# Patient Record
Sex: Male | Born: 1945 | Race: White | Hispanic: No | Marital: Single | Smoking: Never smoker
Health system: Southern US, Community
[De-identification: ages and names within clinical notes are randomized; demographics above are authoritative.]

## PROBLEM LIST (undated history)

## (undated) DIAGNOSIS — C801 Malignant (primary) neoplasm, unspecified: Secondary | ICD-10-CM

## (undated) DIAGNOSIS — M1990 Unspecified osteoarthritis, unspecified site: Secondary | ICD-10-CM

## (undated) DIAGNOSIS — C49A4 Gastrointestinal stromal tumor of large intestine: Secondary | ICD-10-CM

## (undated) HISTORY — PX: OTHER SURGICAL HISTORY: SHX169

## (undated) HISTORY — DX: Gastrointestinal stromal tumor of large intestine: C49.A4

---

## 2013-01-11 ENCOUNTER — Ambulatory Visit: Payer: Self-pay | Admitting: Oncology

## 2013-02-16 ENCOUNTER — Ambulatory Visit: Payer: Self-pay | Admitting: Oncology

## 2013-02-16 LAB — COMPREHENSIVE METABOLIC PANEL
Albumin: 3.6 g/dL (ref 3.4–5.0)
Alkaline Phosphatase: 95 U/L (ref 50–136)
BUN: 17 mg/dL (ref 7–18)
Bilirubin,Total: 0.3 mg/dL (ref 0.2–1.0)
Calcium, Total: 9.3 mg/dL (ref 8.5–10.1)
Co2: 30 mmol/L (ref 21–32)
Osmolality: 276 (ref 275–301)
Potassium: 3.7 mmol/L (ref 3.5–5.1)
SGOT(AST): 15 U/L (ref 15–37)
SGPT (ALT): 30 U/L (ref 12–78)
Sodium: 137 mmol/L (ref 136–145)
Total Protein: 7.5 g/dL (ref 6.4–8.2)

## 2013-02-16 LAB — CBC CANCER CENTER
Basophil #: 0.1 x10 3/mm (ref 0.0–0.1)
Eosinophil #: 0.1 x10 3/mm (ref 0.0–0.7)
HCT: 47 % (ref 40.0–52.0)
HGB: 15.7 g/dL (ref 13.0–18.0)
Lymphocyte #: 1.8 x10 3/mm (ref 1.0–3.6)
MCH: 30.7 pg (ref 26.0–34.0)
MCHC: 33.4 g/dL (ref 32.0–36.0)
MCV: 92 fL (ref 80–100)
Monocyte %: 8.2 %
Neutrophil #: 6.3 x10 3/mm (ref 1.4–6.5)
Neutrophil %: 70 %
Platelet: 325 x10 3/mm (ref 150–440)
RBC: 5.11 10*6/uL (ref 4.40–5.90)
RDW: 13.6 % (ref 11.5–14.5)

## 2013-02-16 LAB — LACTATE DEHYDROGENASE: LDH: 165 U/L (ref 85–241)

## 2013-03-06 ENCOUNTER — Ambulatory Visit: Payer: Self-pay | Admitting: Oncology

## 2013-05-15 ENCOUNTER — Ambulatory Visit: Payer: Self-pay | Admitting: Gastroenterology

## 2013-05-16 LAB — PATHOLOGY REPORT

## 2013-05-17 ENCOUNTER — Ambulatory Visit: Payer: Self-pay | Admitting: Oncology

## 2013-05-18 LAB — COMPREHENSIVE METABOLIC PANEL
ALBUMIN: 3.6 g/dL (ref 3.4–5.0)
AST: 16 U/L (ref 15–37)
Alkaline Phosphatase: 77 U/L
Anion Gap: 8 (ref 7–16)
BILIRUBIN TOTAL: 0.4 mg/dL (ref 0.2–1.0)
BUN: 11 mg/dL (ref 7–18)
CREATININE: 0.79 mg/dL (ref 0.60–1.30)
Calcium, Total: 8.3 mg/dL — ABNORMAL LOW (ref 8.5–10.1)
Chloride: 101 mmol/L (ref 98–107)
Co2: 30 mmol/L (ref 21–32)
EGFR (African American): >60
EGFR (Non-African Amer.): >60
GLUCOSE: 103 mg/dL — AB (ref 65–99)
OSMOLALITY: 277 (ref 275–301)
Potassium: 3.8 mmol/L (ref 3.5–5.1)
SGPT (ALT): 24 U/L (ref 12–78)
Sodium: 139 mmol/L (ref 136–145)
Total Protein: 7 g/dL (ref 6.4–8.2)

## 2013-05-18 LAB — CBC CANCER CENTER
BASOS PCT: 0.6 %
Basophil #: 0.1 x10 3/mm (ref 0.0–0.1)
EOS ABS: 0.1 x10 3/mm (ref 0.0–0.7)
Eosinophil %: 1.3 %
HCT: 46.2 % (ref 40.0–52.0)
HGB: 15.4 g/dL (ref 13.0–18.0)
LYMPHS PCT: 13.3 %
Lymphocyte #: 1.2 x10 3/mm (ref 1.0–3.6)
MCH: 30.4 pg (ref 26.0–34.0)
MCHC: 33.3 g/dL (ref 32.0–36.0)
MCV: 92 fL (ref 80–100)
Monocyte #: 0.6 x10 3/mm (ref 0.2–1.0)
Monocyte %: 6.5 %
Neutrophil #: 7.2 x10 3/mm — ABNORMAL HIGH (ref 1.4–6.5)
Neutrophil %: 78.3 %
Platelet: 306 x10 3/mm (ref 150–440)
RBC: 5.05 10*6/uL (ref 4.40–5.90)
RDW: 13.6 % (ref 11.5–14.5)
WBC: 9.2 x10 3/mm (ref 3.8–10.6)

## 2013-06-04 ENCOUNTER — Ambulatory Visit: Payer: Self-pay | Admitting: Oncology

## 2013-07-05 ENCOUNTER — Ambulatory Visit: Payer: Self-pay | Admitting: Oncology

## 2013-08-21 ENCOUNTER — Ambulatory Visit: Payer: Self-pay | Admitting: Oncology

## 2013-08-22 LAB — COMPREHENSIVE METABOLIC PANEL
ANION GAP: 5 — AB (ref 7–16)
Albumin: 3.7 g/dL (ref 3.4–5.0)
Alkaline Phosphatase: 89 U/L
BUN: 14 mg/dL (ref 7–18)
Bilirubin,Total: 0.5 mg/dL (ref 0.2–1.0)
CHLORIDE: 101 mmol/L (ref 98–107)
CREATININE: 0.75 mg/dL (ref 0.60–1.30)
Calcium, Total: 9.1 mg/dL (ref 8.5–10.1)
Co2: 32 mmol/L (ref 21–32)
EGFR (Non-African Amer.): >60
Glucose: 111 mg/dL — ABNORMAL HIGH (ref 65–99)
Osmolality: 277 (ref 275–301)
Potassium: 4.2 mmol/L (ref 3.5–5.1)
SGOT(AST): 14 U/L — ABNORMAL LOW (ref 15–37)
SGPT (ALT): 22 U/L (ref 12–78)
Sodium: 138 mmol/L (ref 136–145)
Total Protein: 7.5 g/dL (ref 6.4–8.2)

## 2013-08-22 LAB — CBC CANCER CENTER
BASOS PCT: 1.1 %
Basophil #: 0.1 x10 3/mm (ref 0.0–0.1)
Eosinophil #: 0 x10 3/mm (ref 0.0–0.7)
Eosinophil %: 0.7 %
HCT: 44.7 % (ref 40.0–52.0)
HGB: 15.4 g/dL (ref 13.0–18.0)
LYMPHS PCT: 15.8 %
Lymphocyte #: 1 x10 3/mm (ref 1.0–3.6)
MCH: 30.9 pg (ref 26.0–34.0)
MCHC: 34.4 g/dL (ref 32.0–36.0)
MCV: 90 fL (ref 80–100)
MONO ABS: 0.6 x10 3/mm (ref 0.2–1.0)
Monocyte %: 8.8 %
Neutrophil #: 4.8 x10 3/mm (ref 1.4–6.5)
Neutrophil %: 73.6 %
Platelet: 317 x10 3/mm (ref 150–440)
RBC: 4.98 10*6/uL (ref 4.40–5.90)
RDW: 13.5 % (ref 11.5–14.5)
WBC: 6.5 x10 3/mm (ref 3.8–10.6)

## 2013-09-04 ENCOUNTER — Ambulatory Visit: Payer: Self-pay | Admitting: Oncology

## 2013-12-19 ENCOUNTER — Ambulatory Visit: Payer: Self-pay | Admitting: Oncology

## 2013-12-21 ENCOUNTER — Ambulatory Visit: Payer: Self-pay | Admitting: Oncology

## 2013-12-25 LAB — CANCER ANTIGEN 19-9: CA 19-9: <1 U/mL (ref 0–35)

## 2013-12-25 LAB — CEA: CEA: 1.6 ng/mL (ref 0.0–4.7)

## 2014-01-04 ENCOUNTER — Ambulatory Visit: Payer: Self-pay | Admitting: Oncology

## 2014-06-21 ENCOUNTER — Ambulatory Visit
Admit: 2014-06-21 | Disposition: A | Discharge status: Home / Self Care | Payer: Self-pay | Attending: Oncology | Admitting: Oncology

## 2014-06-21 ENCOUNTER — Ambulatory Visit: Payer: Self-pay | Admitting: Oncology

## 2014-07-06 ENCOUNTER — Ambulatory Visit
Admit: 2014-07-06 | Disposition: A | Discharge status: Home / Self Care | Payer: Self-pay | Attending: Oncology | Admitting: Oncology

## 2014-07-20 ENCOUNTER — Other Ambulatory Visit: Payer: Self-pay | Admitting: Oncology

## 2014-07-20 DIAGNOSIS — C482 Malignant neoplasm of peritoneum, unspecified: Principal | ICD-10-CM

## 2014-07-20 DIAGNOSIS — C269 Malignant neoplasm of ill-defined sites within the digestive system: Secondary | ICD-10-CM

## 2014-12-24 ENCOUNTER — Inpatient Hospital Stay: Payer: Medicare Other | Attending: Oncology | Admitting: *Deleted

## 2014-12-24 ENCOUNTER — Ambulatory Visit
Admission: RE | Admit: 2014-12-24 | Discharge: 2014-12-24 | Disposition: A | Discharge status: Home / Self Care | Payer: Medicare Other | Source: Ambulatory Visit | Attending: Oncology | Admitting: Oncology

## 2014-12-24 DIAGNOSIS — R1901 Right upper quadrant abdominal swelling, mass and lump: Secondary | ICD-10-CM | POA: Diagnosis not present

## 2014-12-24 DIAGNOSIS — C494 Malignant neoplasm of connective and soft tissue of abdomen: Secondary | ICD-10-CM | POA: Diagnosis not present

## 2014-12-24 DIAGNOSIS — K659 Peritonitis, unspecified: Secondary | ICD-10-CM | POA: Insufficient documentation

## 2014-12-24 DIAGNOSIS — C269 Malignant neoplasm of ill-defined sites within the digestive system: Secondary | ICD-10-CM | POA: Insufficient documentation

## 2014-12-24 DIAGNOSIS — Z9049 Acquired absence of other specified parts of digestive tract: Secondary | ICD-10-CM | POA: Diagnosis not present

## 2014-12-24 DIAGNOSIS — C482 Malignant neoplasm of peritoneum, unspecified: Secondary | ICD-10-CM

## 2014-12-24 DIAGNOSIS — D214 Benign neoplasm of connective and other soft tissue of abdomen: Secondary | ICD-10-CM

## 2014-12-24 DIAGNOSIS — N281 Cyst of kidney, acquired: Secondary | ICD-10-CM | POA: Diagnosis not present

## 2014-12-24 DIAGNOSIS — Z79899 Other long term (current) drug therapy: Secondary | ICD-10-CM | POA: Insufficient documentation

## 2014-12-24 DIAGNOSIS — R188 Other ascites: Secondary | ICD-10-CM | POA: Insufficient documentation

## 2014-12-24 DIAGNOSIS — R160 Hepatomegaly, not elsewhere classified: Secondary | ICD-10-CM | POA: Diagnosis not present

## 2014-12-24 HISTORY — DX: Malignant (primary) neoplasm, unspecified: C80.1

## 2014-12-24 LAB — COMPREHENSIVE METABOLIC PANEL
ALT: 20 U/L (ref 17–63)
AST: 22 U/L (ref 15–41)
Albumin: 4 g/dL (ref 3.5–5.0)
Alkaline Phosphatase: 88 U/L (ref 38–126)
Anion gap: 7 (ref 5–15)
BUN: 19 mg/dL (ref 6–20)
CHLORIDE: 98 mmol/L — AB (ref 101–111)
CO2: 29 mmol/L (ref 22–32)
Calcium: 8.9 mg/dL (ref 8.9–10.3)
Creatinine, Ser: 0.79 mg/dL (ref 0.61–1.24)
Glucose, Bld: 108 mg/dL — ABNORMAL HIGH (ref 65–99)
POTASSIUM: 3.9 mmol/L (ref 3.5–5.1)
SODIUM: 134 mmol/L — AB (ref 135–145)
Total Bilirubin: 0.7 mg/dL (ref 0.3–1.2)
Total Protein: 7.6 g/dL (ref 6.5–8.1)

## 2014-12-24 LAB — CBC WITH DIFFERENTIAL/PLATELET
BASOS ABS: 0.1 10*3/uL (ref 0–0.1)
Basophils Relative: 1 %
EOS ABS: 0.1 10*3/uL (ref 0–0.7)
EOS PCT: 2 %
HCT: 42.6 % (ref 40.0–52.0)
Hemoglobin: 14.2 g/dL (ref 13.0–18.0)
LYMPHS ABS: 1.3 10*3/uL (ref 1.0–3.6)
Lymphocytes Relative: 19 %
MCH: 30.2 pg (ref 26.0–34.0)
MCHC: 33.4 g/dL (ref 32.0–36.0)
MCV: 90.5 fL (ref 80.0–100.0)
MONO ABS: 0.7 10*3/uL (ref 0.2–1.0)
MONOS PCT: 10 %
NEUTROS ABS: 4.7 10*3/uL (ref 1.4–6.5)
NEUTROS PCT: 68 %
PLATELETS: 371 10*3/uL (ref 150–440)
RBC: 4.71 MIL/uL (ref 4.40–5.90)
RDW: 14.5 % (ref 11.5–14.5)
WBC: 7 10*3/uL (ref 3.8–10.6)

## 2014-12-24 MED ORDER — IOHEXOL 300 MG/ML  SOLN
100.0000 mL | Freq: Once | INTRAMUSCULAR | Status: AC | PRN
  Administered 2014-12-24: 100 mL via INTRAVENOUS

## 2014-12-26 ENCOUNTER — Inpatient Hospital Stay (HOSPITAL_BASED_OUTPATIENT_CLINIC_OR_DEPARTMENT_OTHER): Payer: Medicare Other | Admitting: Oncology

## 2014-12-26 VITALS — BP 121/78 | HR 88 | Temp 96.5°F | Resp 20 | Ht 67.72 in | Wt 147.9 lb

## 2014-12-26 DIAGNOSIS — Z79899 Other long term (current) drug therapy: Secondary | ICD-10-CM

## 2014-12-26 DIAGNOSIS — N281 Cyst of kidney, acquired: Secondary | ICD-10-CM

## 2014-12-26 DIAGNOSIS — R1901 Right upper quadrant abdominal swelling, mass and lump: Secondary | ICD-10-CM | POA: Diagnosis not present

## 2014-12-26 DIAGNOSIS — C494 Malignant neoplasm of connective and soft tissue of abdomen: Secondary | ICD-10-CM

## 2014-12-26 DIAGNOSIS — C49A0 Gastrointestinal stromal tumor, unspecified site: Secondary | ICD-10-CM

## 2014-12-26 DIAGNOSIS — R188 Other ascites: Secondary | ICD-10-CM | POA: Diagnosis not present

## 2014-12-26 DIAGNOSIS — Z9049 Acquired absence of other specified parts of digestive tract: Secondary | ICD-10-CM

## 2014-12-26 NOTE — Progress Notes (Signed)
Patient here today for routine follow up today regarding GIST, CT results.

## 2014-12-27 ENCOUNTER — Telehealth: Payer: Self-pay | Admitting: *Deleted

## 2014-12-27 ENCOUNTER — Ambulatory Visit: Payer: Medicare Other | Admitting: Oncology

## 2014-12-27 NOTE — Progress Notes (Signed)
[place]  [phone] [phone]) [phone]  ID: Carin Primrose OB: [birth year]  MR#: [id number]  TDV#:[id number]  Patient Care Team: Rusty Aus, MD as PCP - General (Internal Medicine)  CHIEF COMPLAINT:  Chief Complaint  Patient presents with  . OTHER    GIST    INTERVAL HISTORY: Patient returns to clinic today for routine six-month evaluation and discussion of his imaging results.  Please see clinic note dated February 16, 2013 for a detailed history of his disease.  He continues to feel well and is asymptomatic.  He has no neurologic complaints.  He denies any recent fevers or illnesses.  He has no neurologic complaints.  He has a good appetite and denies weight loss.  He denies any pain.  He denies any nausea, vomiting, constipation, or diarrhea.  He denies any melena or hematochezia.  He has no urinary complaints.  Patient offers no specific complaints today.   REVIEW OF SYSTEMS:   Review of Systems  Constitutional: Negative.   Respiratory: Negative.   Cardiovascular: Negative.   Gastrointestinal: Negative.  Negative for vomiting, abdominal pain and diarrhea.  Neurological: Negative.     As per HPI. Otherwise, a complete review of systems is negatve.  PAST MEDICAL HISTORY: Past Medical History  Diagnosis Date  . Cancer     PAST SURGICAL HISTORY: No past surgical history on file.  FAMILY HISTORY No family history on file.     ADVANCED DIRECTIVES:    HEALTH MAINTENANCE: Social History  Substance Use Topics  . Smoking status: Not on file  . Smokeless tobacco: Not on file  . Alcohol Use: Not on file     Colonoscopy:  PAP:  Bone density:  Lipid panel:  Allergies  Allergen Reactions  . Morphine And Related Other (See Comments)    Very confused.   Marland Kitchen Penicillins Hives    Itch and breakout in hives.    Current Outpatient Prescriptions  Medication Sig Dispense Refill  . celecoxib (CELEBREX) 200 MG capsule Take 200 mg by  mouth daily.    Marland Kitchen esomeprazole (NEXIUM) 40 MG capsule Take 40 mg by mouth daily at 12 noon.    Marland Kitchen LORazepam (ATIVAN) 0.5 MG tablet Take 0.5 mg by mouth every 8 (eight) hours as needed for anxiety.     No current facility-administered medications for this visit.    OBJECTIVE: Filed Vitals:   12/26/14 1132  BP: 121/78  Pulse: 88  Temp: 96.5 F (35.8 C)  Resp: 20     Body mass index is 22.68 kg/(m^2).    ECOG FS:0 - Asymptomatic  General: Well-developed, well-nourished, no acute distress. Eyes: Pink conjunctiva, anicteric sclera. Lungs: Clear to auscultation bilaterally. Heart: Regular rate and rhythm. No rubs, murmurs, or gallops. Abdomen: Soft, nontender, nondistended. No organomegaly noted, normoactive bowel sounds. No palpable masses. Musculoskeletal: No edema, cyanosis, or clubbing. Neuro: Alert, answering all questions appropriately. Cranial nerves grossly intact. Skin: No rashes or petechiae noted. Psych: Normal affect.   LAB RESULTS:  Lab Results  Component Value Date   NA 134* 12/24/2014   K 3.9 12/24/2014   CL 98* 12/24/2014   CO2 29 12/24/2014   GLUCOSE 108* 12/24/2014   BUN 19 12/24/2014   CREATININE 0.79 12/24/2014   CALCIUM 8.9 12/24/2014   PROT 7.6 12/24/2014   ALBUMIN 4.0 12/24/2014   AST 22 12/24/2014   ALT 20 12/24/2014   ALKPHOS 88 12/24/2014   BILITOT 0.7 12/24/2014   GFRNONAA >60 12/24/2014   GFRAA >60  12/24/2014    Lab Results  Component Value Date   WBC 7.0 12/24/2014   NEUTROABS 4.7 12/24/2014   HGB 14.2 12/24/2014   HCT 42.6 12/24/2014   MCV 90.5 12/24/2014   PLT 371 12/24/2014     STUDIES: Ct Abdomen Pelvis W Contrast  12/24/2014   CLINICAL DATA:  Evaluate abdominal growth. Restaging metastatic GIST tumor  EXAM: CT ABDOMEN AND PELVIS WITH CONTRAST  TECHNIQUE: Multidetector CT imaging of the abdomen and pelvis was performed using the standard protocol following bolus administration of intravenous contrast.  CONTRAST:  128mL OMNIPAQUE  IOHEXOL 300 MG/ML  SOLN  COMPARISON:  06/21/2014.  FINDINGS: Lower chest: No pleural or pericardial effusion. The lung bases appear clear.  Hepatobiliary: No suspicious liver abnormality identified. Previous cholecystectomy. No biliary dilatation.  Pancreas: Negative  Spleen: Negative  Adrenals/Urinary Tract: The adrenal glands are negative. Inferior pole right renal cysts measures 1.7 cm, image 35 of series 2. Left kidney is negative. The urinary bladder appears normal.  Stomach/Bowel: Status post partial gastrectomy. The small bowel loops have a normal caliber. No evidence for bowel obstruction. Normal appearance of the colon.  Vascular/Lymphatic: Calcified atherosclerotic disease involves the abdominal aorta. No aneurysm. No enlarged retroperitoneal or mesenteric adenopathy. There is no retroperitoneal adenopathy. No enlarged mesenteric lymph nodes identified. No pelvic or inguinal adenopathy.  Reproductive: Prostate gland enlargement. Symmetric appearance of the seminal vesicles.  Other: There is new intermediate to high attenuation fluid within the dependent portion of the pelvis, image 64/series 2. Numerous peritoneal masses are identified within the upper abdomen. The dominant lesion between the head of pancreas and right lobe of liver measures 7.6 x 8.4 cm, image 26 of series 2. This is new when compared with the previous examination. The index lesion within the left upper quadrant of the abdomen measures 2 cm, image 14/ series 2. Previously this measured the same. The index lesion along the greater curvature of the stomach measures 2.4 cm, image 20/series 2 previously 2.1 cm. Nodule just below the ventral abdominal wall measures 2.8 cm, image 33/series 2. Previously 2.1 cm.  Musculoskeletal: No aggressive lytic or sclerotic bone lesions identified. Degenerative disc disease is identified at the L2-3 level.  IMPRESSION: 1. Interval progression of disease. There is a new large heterogeneous mass between the  head of pancreas an the right lobe of liver which measures 8.4 cm. Other peritoneal lesions have increased in size in the interval. 2. New high attenuation fluid within the dependent portion of the pelvis is indeterminate. This finding may be found with hemo peritoneum. Complex ascites secondary to peritonitis or peritoneal spread of tumor is typically not this dense. Clinical correlation is advised. 3. Results were called by telephone at the time of interpretation on 12/24/2014 at 10:05 am to Dr. Delight Hoh , who verbally acknowledged these results.   Electronically Signed   By: Kerby Moors M.D.   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  PLAN:    1. GIST: He did not receive adjuvant Gleevec. See clinic note dated February 16, 2013 for detailed history of his disease process.  CT scan results reviewed independently with mild progression of disease and greater then 8 cm heterogeneous mass near the head of the pancreas. Patient was discussed at cancer conference and there was a question if this could be related to a bleed and not progression of disease. Plan to repeat CT scan in 4 weeks to assess for interval change. If there  is no change or continued progression, mass can be easily biopsied by EUS. Patient will return to clinic 1 to 2 days after his repeat CT scan to discuss the results.  Approximately 30 minutes was spent in discussion and consultation.  Patient expressed understanding and was in agreement with this plan. He also understands that He can call clinic at any time with any questions, concerns, or complaints.   No matching staging information was found for the patient.  Lloyd Huger, MD   12/27/2014 4:24 PM

## 2014-12-27 NOTE — Telephone Encounter (Signed)
I spoke with patient regarding follow up needed based on CT results. I informed patient that case was discussed at conference today, per Dr. Grayland Ormond results could indicate bleeding that would resolve in 2-4 weeks therefore CT scan would be repeated in 4 weeks to reassess. Patient was relieved with results and verbalized understanding.

## 2015-01-24 ENCOUNTER — Ambulatory Visit
Admission: RE | Admit: 2015-01-24 | Discharge: 2015-01-24 | Disposition: A | Discharge status: Home / Self Care | Payer: Medicare Other | Source: Ambulatory Visit | Attending: Oncology | Admitting: Oncology

## 2015-01-24 DIAGNOSIS — K439 Ventral hernia without obstruction or gangrene: Secondary | ICD-10-CM | POA: Diagnosis not present

## 2015-01-24 DIAGNOSIS — C49A0 Gastrointestinal stromal tumor, unspecified site: Secondary | ICD-10-CM | POA: Diagnosis present

## 2015-01-24 DIAGNOSIS — I700 Atherosclerosis of aorta: Secondary | ICD-10-CM | POA: Diagnosis not present

## 2015-01-24 MED ORDER — IOHEXOL 300 MG/ML  SOLN
100.0000 mL | Freq: Once | INTRAMUSCULAR | Status: AC | PRN
  Administered 2015-01-24: 85 mL via INTRAVENOUS

## 2015-01-28 ENCOUNTER — Inpatient Hospital Stay: Payer: Medicare Other | Attending: Oncology | Admitting: Oncology

## 2015-01-28 VITALS — BP 112/73 | HR 88 | Temp 96.2°F | Resp 20 | Wt 149.9 lb

## 2015-01-28 DIAGNOSIS — C49A2 Gastrointestinal stromal tumor of stomach: Secondary | ICD-10-CM | POA: Diagnosis not present

## 2015-01-28 DIAGNOSIS — Z79899 Other long term (current) drug therapy: Secondary | ICD-10-CM | POA: Insufficient documentation

## 2015-01-28 DIAGNOSIS — K439 Ventral hernia without obstruction or gangrene: Secondary | ICD-10-CM | POA: Diagnosis not present

## 2015-01-28 DIAGNOSIS — I2510 Atherosclerotic heart disease of native coronary artery without angina pectoris: Secondary | ICD-10-CM | POA: Diagnosis not present

## 2015-01-28 DIAGNOSIS — C49A0 Gastrointestinal stromal tumor, unspecified site: Secondary | ICD-10-CM | POA: Diagnosis not present

## 2015-01-28 DIAGNOSIS — C49A4 Gastrointestinal stromal tumor of large intestine: Secondary | ICD-10-CM

## 2015-01-28 NOTE — Progress Notes (Signed)
Patient here today for follow up regarding GIST, CT scan results.

## 2015-01-29 ENCOUNTER — Other Ambulatory Visit: Payer: Self-pay | Admitting: Radiology

## 2015-01-30 ENCOUNTER — Other Ambulatory Visit: Payer: Self-pay | Admitting: Radiology

## 2015-01-30 NOTE — Progress Notes (Signed)
[place]  Telephone:(336) [phone] [phone]) [phone]  ID: Curtis Valentine OB: 14-Jul-1945  MR#: [id number]  HUD#:[id number]  Patient Care Team: Rusty Aus, MD as PCP - General (Internal Medicine)  CHIEF COMPLAINT:  Chief Complaint  Patient presents with  . OTHER    GIST-Follow up; CT scan    INTERVAL HISTORY: Patient returns to clinic today for further evaluation and discussion of his imaging results. Please see clinic note dated February 16, 2013 for a detailed history of his disease.  He is highly anxious, but otherwise feels well.  He has no neurologic complaints.  He denies any recent fevers or illnesses.  He has no neurologic complaints.  He has a good appetite and denies weight loss.  He denies any pain.  He denies any nausea, vomiting, constipation, or diarrhea.  He denies any melena or hematochezia.  He has no urinary complaints.  Patient offers no specific complaints today.   REVIEW OF SYSTEMS:   Review of Systems  Constitutional: Negative.   Respiratory: Negative.   Cardiovascular: Negative.   Gastrointestinal: Negative.  Negative for nausea, vomiting, abdominal pain, diarrhea, constipation, blood in stool and melena.  Neurological: Negative.   Psychiatric/Behavioral: The patient is nervous/anxious.     As per HPI. Otherwise, a complete review of systems is negatve.  PAST MEDICAL HISTORY: Past Medical History  Diagnosis Date  . Cancer [facility])     GIST 2011 per pt    PAST SURGICAL HISTORY: No past surgical history on file.  FAMILY HISTORY: Reviewed and unchanged. No reported history of malignancy or chronic disease.     ADVANCED DIRECTIVES:    HEALTH MAINTENANCE: Social History  Substance Use Topics  . Smoking status: Not on file  . Smokeless tobacco: Not on file  . Alcohol Use: Not on file     Colonoscopy:  PAP:  Bone density:  Lipid panel:  Allergies  Allergen Reactions  . Morphine And Related Other (See Comments)    Very  confused.   Marland Kitchen Penicillins Hives    Itch and breakout in hives.    Current Outpatient Prescriptions  Medication Sig Dispense Refill  . celecoxib (CELEBREX) 200 MG capsule Take 200 mg by mouth daily.    Marland Kitchen esomeprazole (NEXIUM) 40 MG capsule Take 40 mg by mouth daily at 12 noon.    Marland Kitchen LORazepam (ATIVAN) 0.5 MG tablet Take 0.5 mg by mouth every 8 (eight) hours as needed for anxiety.     No current facility-administered medications for this visit.    OBJECTIVE: Filed Vitals:   01/28/15 1511  BP: 112/73  Pulse: 88  Temp: 96.2 F (35.7 C)  Resp: 20     Body mass index is 22.99 kg/(m^2).    ECOG FS:0 - Asymptomatic  General: Well-developed, well-nourished, no acute distress. Eyes: Pink conjunctiva, anicteric sclera. Lungs: Clear to auscultation bilaterally. Heart: Regular rate and rhythm. No rubs, murmurs, or gallops. Abdomen: Soft, nontender, nondistended. No organomegaly noted, normoactive bowel sounds. No palpable masses. Musculoskeletal: No edema, cyanosis, or clubbing. Neuro: Alert, answering all questions appropriately. Cranial nerves grossly intact. Skin: No rashes or petechiae noted. Psych: Normal affect.   LAB RESULTS:  Lab Results  Component Value Date   NA 134* 12/24/2014   K 3.9 12/24/2014   CL 98* 12/24/2014   CO2 29 12/24/2014   GLUCOSE 108* 12/24/2014   BUN 19 12/24/2014   CREATININE 0.79 12/24/2014   CALCIUM 8.9 12/24/2014   PROT 7.6 12/24/2014   ALBUMIN 4.0 12/24/2014  AST 22 12/24/2014   ALT 20 12/24/2014   ALKPHOS 88 12/24/2014   BILITOT 0.7 12/24/2014   GFRNONAA >60 12/24/2014   GFRAA >60 12/24/2014    Lab Results  Component Value Date   WBC 7.0 12/24/2014   NEUTROABS 4.7 12/24/2014   HGB 14.2 12/24/2014   HCT 42.6 12/24/2014   MCV 90.5 12/24/2014   PLT 371 12/24/2014     STUDIES: Ct Abdomen W Contrast  01/24/2015  CLINICAL DATA:  GI stromal tumor with resection. Lesion near the head of the pancreas. EXAM: CT ABDOMEN WITH CONTRAST  TECHNIQUE: Multidetector CT imaging of the abdomen was performed using the standard protocol following bolus administration of intravenous contrast. CONTRAST:  47mL OMNIPAQUE IOHEXOL 300 MG/ML  SOLN COMPARISON:  12/24/2014 FINDINGS: Lower chest:  Unremarkable Hepatobiliary: The large porta hepatis mass is in some locations inseparable from the adjacent liver, although definite signs of liver invasion are not observed. Pancreas: Unremarkable Spleen: Unremarkable Adrenals/Urinary Tract: Right kidney lower pole simple appearing 2.2 cm cyst. Stomach/Bowel: Descending duodenum is extrinsically compressed by the large porta hepatis mass. The mass also abuts the hepatic flexure. Vascular/Lymphatic: Aortoiliac atherosclerotic vascular disease. Other: Porta hepatis mass 8.7 by 9.5 cm on image 26 series 3, formerly 7.5 by 8.4 cm. Solidly enhancing mass along the falciform ligament posteriorly 3.0 by 2.6 cm on image 15 series 3, formerly 2.1 by 2.0 cm. Additional abnormal masses studding along the gastrohepatic ligament, omentum, and upper abdominal mesentery, mildly increased in size. Nodule along the posterior margin of the liver on image 15 of series 3 likewise mildly increased in size. An index lesion centrally in the omentum measures 3.1 by 2.2 cm on image 36 of series 3, formerly 2.8 by 2.0 cm. Musculoskeletal: Ventral hernias containing omental adipose tissue, similar to prior, with a trace amount of fluid in the multilobular supraumbilical ventral hernia. IMPRESSION: 1. Enlarging dominant porta hepatis mass with enlarging scattered satellite lesions along the gastrohepatic ligament, omentum, and mesentery. The porta hepatis mass exerts some extrinsic mass effect on the descending duodenum and abuts the liver and hepatic flexure. 2.  Aortoiliac atherosclerotic vascular disease. 3. Ventral hernias containing adipose tissue. Electronically Signed   By: Van Clines M.D.   On: 01/24/2015 10:42    ASSESSMENT:  Low-grade GIST diagnosed in February of 2011.  PLAN:    1. GIST: He did not receive adjuvant Gleevec. See clinic note dated February 16, 2013 for detailed history of his disease process.  CT scan results reviewed independently with persistent heterogeneous mass near the head of the pancreas. This is highly concerning for recurrence or secondary malignancy therefore will proceed with CT-guided biopsy to confirm diagnosis. If unsuccessful, can consider EUS. Patient will return to clinic one week after his biopsy for further evaluation, discussion of the results, and treatment planning if necessary.   Approximately 30 minutes was spent in discussion and consultation.  Patient expressed understanding and was in agreement with this plan. He also understands that He can call clinic at any time with any questions, concerns, or complaints.   No matching staging information was found for the patient.  Lloyd Huger, MD   01/30/2015 2:02 PM

## 2015-01-31 ENCOUNTER — Ambulatory Visit
Admission: RE | Admit: 2015-01-31 | Discharge: 2015-01-31 | Disposition: A | Discharge status: Home / Self Care | Payer: Medicare Other | Source: Ambulatory Visit | Attending: Oncology | Admitting: Oncology

## 2015-01-31 DIAGNOSIS — Z85038 Personal history of other malignant neoplasm of large intestine: Secondary | ICD-10-CM | POA: Insufficient documentation

## 2015-01-31 DIAGNOSIS — Z01812 Encounter for preprocedural laboratory examination: Secondary | ICD-10-CM | POA: Diagnosis present

## 2015-01-31 DIAGNOSIS — C49A4 Gastrointestinal stromal tumor of large intestine: Secondary | ICD-10-CM | POA: Diagnosis not present

## 2015-01-31 DIAGNOSIS — Z85030 Personal history of malignant carcinoid tumor of large intestine: Secondary | ICD-10-CM | POA: Diagnosis present

## 2015-01-31 DIAGNOSIS — R1900 Intra-abdominal and pelvic swelling, mass and lump, unspecified site: Secondary | ICD-10-CM | POA: Diagnosis present

## 2015-01-31 HISTORY — DX: Unspecified osteoarthritis, unspecified site: M19.90

## 2015-01-31 LAB — BASIC METABOLIC PANEL
ANION GAP: 7 (ref 5–15)
BUN: 22 mg/dL — ABNORMAL HIGH (ref 6–20)
CALCIUM: 8.9 mg/dL (ref 8.9–10.3)
CO2: 29 mmol/L (ref 22–32)
Chloride: 101 mmol/L (ref 101–111)
Creatinine, Ser: 0.79 mg/dL (ref 0.61–1.24)
GLUCOSE: 104 mg/dL — AB (ref 65–99)
Potassium: 4.2 mmol/L (ref 3.5–5.1)
SODIUM: 137 mmol/L (ref 135–145)

## 2015-01-31 LAB — CBC WITH DIFFERENTIAL/PLATELET
BASOS ABS: 0.2 10*3/uL — AB (ref 0–0.1)
BASOS PCT: 2 %
Eosinophils Absolute: 0.1 10*3/uL (ref 0–0.7)
Eosinophils Relative: 1 %
HEMATOCRIT: 45.5 % (ref 40.0–52.0)
Hemoglobin: 15.3 g/dL (ref 13.0–18.0)
Lymphocytes Relative: 14 %
Lymphs Abs: 1.1 10*3/uL (ref 1.0–3.6)
MCH: 30.6 pg (ref 26.0–34.0)
MCHC: 33.5 g/dL (ref 32.0–36.0)
MCV: 91.2 fL (ref 80.0–100.0)
MONO ABS: 0.6 10*3/uL (ref 0.2–1.0)
Monocytes Relative: 7 %
NEUTROS ABS: 6.1 10*3/uL (ref 1.4–6.5)
Neutrophils Relative %: 76 %
PLATELETS: 321 10*3/uL (ref 150–440)
RBC: 5 MIL/uL (ref 4.40–5.90)
RDW: 13.9 % (ref 11.5–14.5)
WBC: 8 10*3/uL (ref 3.8–10.6)

## 2015-01-31 LAB — PROTIME-INR
INR: 0.95
Prothrombin Time: 12.9 seconds (ref 11.4–15.0)

## 2015-01-31 LAB — APTT: APTT: 29 s (ref 24–36)

## 2015-01-31 MED ORDER — FENTANYL CITRATE (PF) 100 MCG/2ML IJ SOLN
INTRAMUSCULAR | Status: AC | PRN
  Administered 2015-01-31: 50 ug via INTRAVENOUS

## 2015-01-31 MED ORDER — FENTANYL CITRATE (PF) 100 MCG/2ML IJ SOLN
INTRAMUSCULAR | Status: AC
  Filled 2015-01-31: qty 4

## 2015-01-31 MED ORDER — HYDROCODONE-ACETAMINOPHEN 5-325 MG PO TABS: 1.0000 | ORAL_TABLET | ORAL | Status: DC | PRN

## 2015-01-31 MED ORDER — SODIUM CHLORIDE 0.9 % IV SOLN
INTRAVENOUS | Status: DC
  Administered 2015-01-31: 12:00:00 via INTRAVENOUS

## 2015-01-31 MED ORDER — MIDAZOLAM HCL 5 MG/5ML IJ SOLN
INTRAMUSCULAR | Status: AC | PRN
  Administered 2015-01-31: 1 mg via INTRAVENOUS

## 2015-01-31 MED ORDER — MIDAZOLAM HCL 5 MG/5ML IJ SOLN
INTRAMUSCULAR | Status: AC
  Filled 2015-01-31: qty 5

## 2015-01-31 NOTE — Procedures (Signed)
Interventional Radiology Procedure Note  Procedure:  CT guided core biopsy of peritoneal mass  Complications:  None  Estimated Blood Loss: < 10 mL  18 G core biopsy x 3 of dominant peritoneal mass.    Venetia Night. Kathlene Cote, M.D Pager:  [phone]

## 2015-02-04 LAB — SURGICAL PATHOLOGY

## 2015-02-07 ENCOUNTER — Ambulatory Visit: Payer: Medicare Other | Admitting: Oncology

## 2015-02-11 ENCOUNTER — Inpatient Hospital Stay: Payer: Medicare Other | Attending: Oncology | Admitting: Oncology

## 2015-02-11 VITALS — BP 127/82 | HR 82 | Temp 96.5°F | Resp 18 | Wt 152.3 lb

## 2015-02-11 DIAGNOSIS — C49A0 Gastrointestinal stromal tumor, unspecified site: Secondary | ICD-10-CM | POA: Diagnosis not present

## 2015-02-11 DIAGNOSIS — F419 Anxiety disorder, unspecified: Secondary | ICD-10-CM | POA: Diagnosis not present

## 2015-02-11 DIAGNOSIS — M129 Arthropathy, unspecified: Secondary | ICD-10-CM | POA: Diagnosis not present

## 2015-02-11 DIAGNOSIS — Z79899 Other long term (current) drug therapy: Secondary | ICD-10-CM | POA: Insufficient documentation

## 2015-02-11 MED ORDER — IMATINIB MESYLATE 400 MG PO TABS: 400.0000 mg | ORAL_TABLET | Freq: Every day | ORAL | Status: DC

## 2015-02-13 ENCOUNTER — Telehealth: Payer: Self-pay | Admitting: *Deleted

## 2015-02-13 ENCOUNTER — Other Ambulatory Visit: Payer: Self-pay | Admitting: *Deleted

## 2015-02-13 DIAGNOSIS — C49A0 Gastrointestinal stromal tumor, unspecified site: Secondary | ICD-10-CM

## 2015-02-13 MED ORDER — IMATINIB MESYLATE 400 MG PO TABS: 400.0000 mg | ORAL_TABLET | Freq: Every day | ORAL | Status: DC

## 2015-02-13 NOTE — Telephone Encounter (Signed)
Call received from [employer] that patients copay for [place] is $3700. Per [facility] patient is eligible for assistance if we prescribe brand name Gleevec only. New RX for brand name Gleevec sent to [facility].

## 2015-02-18 ENCOUNTER — Encounter: Payer: Self-pay | Admitting: *Deleted

## 2015-02-18 NOTE — Progress Notes (Signed)
Call received from [employer] to inform office patients RX for [place]  is being processed by their prior authorization department, will call with approval once received.

## 2015-02-21 NOTE — Progress Notes (Signed)
[place]  Telephone:(336) [phone] Fax:(336) [phone]  ID: Carin Primrose OB: [birth year]  MR#: [id number]  [id number]  Patient Care Team: Rusty Aus, MD as PCP - General (Internal Medicine)  CHIEF COMPLAINT:  Chief Complaint  Patient presents with  . GIST    discuss biopsy resutls    INTERVAL HISTORY: Patient returns to clinic today for further evaluation and discussion of his biopsy results. Please see clinic note dated February 16, 2013 for a detailed history of his disease.  He continues to be highly anxious, but otherwise feels well.  He has no neurologic complaints.  He denies any recent fevers or illnesses.  He has no neurologic complaints.  He has a good appetite and denies weight loss.  He denies any pain.  He denies any nausea, vomiting, constipation, or diarrhea.  He denies any melena or hematochezia.  He has no urinary complaints.  Patient offers no further specific complaints today.   REVIEW OF SYSTEMS:   Review of Systems  Constitutional: Negative.   Respiratory: Negative.   Cardiovascular: Negative.   Gastrointestinal: Negative.  Negative for nausea, vomiting, abdominal pain, diarrhea, constipation, blood in stool and melena.  Neurological: Negative.   Psychiatric/Behavioral: The patient is nervous/anxious.     As per HPI. Otherwise, a complete review of systems is negatve.  PAST MEDICAL HISTORY: Past Medical History  Diagnosis Date  . Cancer [facility])     GIST 2011 per pt  . Arthritis     PAST SURGICAL HISTORY: No past surgical history on file.  FAMILY HISTORY: Reviewed and unchanged. No reported history of malignancy or chronic disease.     ADVANCED DIRECTIVES:    HEALTH MAINTENANCE: Social History  Substance Use Topics  . Smoking status: Never Smoker   . Smokeless tobacco: Never Used  . Alcohol Use: Not on file     Comment: wine-occasional     Colonoscopy:  PAP:  Bone density:  Lipid panel:  Allergies  Allergen  Reactions  . Morphine And Related Other (See Comments)    Very confused.   Marland Kitchen Penicillins Hives    Itch and breakout in hives.    Current Outpatient Prescriptions  Medication Sig Dispense Refill  . celecoxib (CELEBREX) 200 MG capsule Take 200 mg by mouth daily.    Marland Kitchen esomeprazole (NEXIUM) 40 MG capsule Take 40 mg by mouth daily at 12 noon.    Marland Kitchen LORazepam (ATIVAN) 0.5 MG tablet Take 0.5 mg by mouth every 8 (eight) hours as needed for anxiety.    Marland Kitchen imatinib (GLEEVEC) 400 MG tablet Take 1 tablet (400 mg total) by mouth daily. Take with meals and large glass of water.Caution:Chemotherapy. 30 tablet 5  . imatinib (GLEEVEC) 400 MG tablet Take 1 tablet (400 mg total) by mouth daily. Take with meals and large glass of water.Caution:Chemotherapy. 30 tablet 5  . imatinib (GLEEVEC) 400 MG tablet Take 1 tablet (400 mg total) by mouth daily. Take with meals and large glass of water.Caution:Chemotherapy. 30 tablet 5   No current facility-administered medications for this visit.    OBJECTIVE: Filed Vitals:   02/11/15 0936  BP: 127/82  Pulse: 82  Temp: 96.5 F (35.8 C)  Resp: 18     Body mass index is 23.17 kg/(m^2).    ECOG FS:0 - Asymptomatic  General: Well-developed, well-nourished, no acute distress. Eyes: Pink conjunctiva, anicteric sclera. Lungs: Clear to auscultation bilaterally. Heart: Regular rate and rhythm. No rubs, murmurs, or gallops. Abdomen: Soft, nontender, nondistended. No organomegaly noted, normoactive  bowel sounds. No palpable masses. Musculoskeletal: No edema, cyanosis, or clubbing. Neuro: Alert, answering all questions appropriately. Cranial nerves grossly intact. Skin: No rashes or petechiae noted. Psych: Normal affect.   LAB RESULTS:  Lab Results  Component Value Date   NA 137 01/31/2015   K 4.2 01/31/2015   CL 101 01/31/2015   CO2 29 01/31/2015   GLUCOSE 104* 01/31/2015   BUN 22* 01/31/2015   CREATININE 0.79 01/31/2015   CALCIUM 8.9 01/31/2015   PROT 7.6  12/24/2014   ALBUMIN 4.0 12/24/2014   AST 22 12/24/2014   ALT 20 12/24/2014   ALKPHOS 88 12/24/2014   BILITOT 0.7 12/24/2014   GFRNONAA >60 01/31/2015   GFRAA >60 01/31/2015    Lab Results  Component Value Date   WBC 8.0 01/31/2015   NEUTROABS 6.1 01/31/2015   HGB 15.3 01/31/2015   HCT 45.5 01/31/2015   MCV 91.2 01/31/2015   PLT 321 01/31/2015     STUDIES: Ct Abdomen W Contrast  01/24/2015  CLINICAL DATA:  GI stromal tumor with resection. Lesion near the head of the pancreas. EXAM: CT ABDOMEN WITH CONTRAST TECHNIQUE: Multidetector CT imaging of the abdomen was performed using the standard protocol following bolus administration of intravenous contrast. CONTRAST:  39mL OMNIPAQUE IOHEXOL 300 MG/ML  SOLN COMPARISON:  12/24/2014 FINDINGS: Lower chest:  Unremarkable Hepatobiliary: The large porta hepatis mass is in some locations inseparable from the adjacent liver, although definite signs of liver invasion are not observed. Pancreas: Unremarkable Spleen: Unremarkable Adrenals/Urinary Tract: Right kidney lower pole simple appearing 2.2 cm cyst. Stomach/Bowel: Descending duodenum is extrinsically compressed by the large porta hepatis mass. The mass also abuts the hepatic flexure. Vascular/Lymphatic: Aortoiliac atherosclerotic vascular disease. Other: Porta hepatis mass 8.7 by 9.5 cm on image 26 series 3, formerly 7.5 by 8.4 cm. Solidly enhancing mass along the falciform ligament posteriorly 3.0 by 2.6 cm on image 15 series 3, formerly 2.1 by 2.0 cm. Additional abnormal masses studding along the gastrohepatic ligament, omentum, and upper abdominal mesentery, mildly increased in size. Nodule along the posterior margin of the liver on image 15 of series 3 likewise mildly increased in size. An index lesion centrally in the omentum measures 3.1 by 2.2 cm on image 36 of series 3, formerly 2.8 by 2.0 cm. Musculoskeletal: Ventral hernias containing omental adipose tissue, similar to prior, with a trace  amount of fluid in the multilobular supraumbilical ventral hernia. IMPRESSION: 1. Enlarging dominant porta hepatis mass with enlarging scattered satellite lesions along the gastrohepatic ligament, omentum, and mesentery. The porta hepatis mass exerts some extrinsic mass effect on the descending duodenum and abuts the liver and hepatic flexure. 2.  Aortoiliac atherosclerotic vascular disease. 3. Ventral hernias containing adipose tissue. Electronically Signed   By: Van Clines M.D.   On: 01/24/2015 10:42   Ct Biopsy  01/31/2015  CLINICAL DATA:  History of GIST with enlarging dominant right-sided abdominal mass and additional peritoneal masses. Prior to initiation of new therapy, request has been made to sample the dominant enlarging lesion for pathologic correlation. EXAM: CT GUIDED CORE BIOPSY OF PERITONEAL ABDOMINAL MASS ANESTHESIA/SEDATION: 1.0  Mg IV Versed; 50 mcg IV Fentanyl Total Moderate Sedation Time: 14 minutes. PROCEDURE: The procedure risks, benefits, and alternatives were explained to the patient. Questions regarding the procedure were encouraged and answered. The patient understands and consents to the procedure. A time-out was performed prior to the procedure. The right lateral abdominal wall was prepped with chlorhexidinein a sterile fashion, and a sterile drape was applied  covering the operative field. A sterile gown and sterile gloves were used for the procedure. Local anesthesia was provided with 1% Lidocaine. Under CT guidance, a 17 gauge trocar needle was advanced from a lateral approach to the level of the dominant right abdominal mass. A total of 3 separate 18 gauge core biopsy samples were then obtained from the mass. Material was submitted in formalin. The outer needle was then removed and additional CT performed. COMPLICATIONS: None FINDINGS: Dominant right-sided peritoneal mass located just inferior to the porta hepatis measures 10.4 cm. Solid tissue was obtained. There were no  immediate complications. IMPRESSION: CT-guided core biopsy performed of the dominant right-sided peritoneal mass. Electronically Signed   By: Aletta Edouard M.D.   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  PLAN:    1. GIST: See clinic note dated February 16, 2013 for detailed history of his disease process.  CT scan results reviewed independently with persistent heterogeneous mass near the head of the pancreas.  biopsy results confirm recurrent GIST. Patient is not a surgical candidate, therefore will proceed with 400 mg Gleevec daily. He did not receive clear VAC adjuvantly when he was originally diagnosed in February 2011. Patient has requested to not initiate treatment until after Thanksgiving. Return to clinic in 6 weeks for repeat laboratory work and to assess his toleration of [place]. 2. Anxiety: Continue Ativan 0.5 mg 3 times a day as needed.  Approximately 30 minutes was spent in discussion of which greater than 50% was consultation.  Patient expressed understanding and was in agreement with this plan. He also understands that He can call clinic at any time with any questions, concerns, or complaints.    Lloyd Huger, MD   02/21/2015 9:36 AM

## 2015-02-26 ENCOUNTER — Encounter: Payer: Self-pay | Admitting: *Deleted

## 2015-02-26 NOTE — Progress Notes (Signed)
Denial for Gleevec:  Ref. # : [id number] Phone#: [phone]

## 2015-02-27 ENCOUNTER — Telehealth: Payer: Self-pay | Admitting: *Deleted

## 2015-02-27 NOTE — Telephone Encounter (Signed)
Received call stating appeal has been approved and they will mail [place] a letter

## 2015-02-27 NOTE — Telephone Encounter (Signed)
Expedited appeal initiated for Gleevec 400 mg tablets QD. Per representative there should be response in 72 hours.

## 2015-03-04 ENCOUNTER — Telehealth: Payer: Self-pay | Admitting: *Deleted

## 2015-03-04 NOTE — Telephone Encounter (Signed)
Call received from [facility] at [place], patients co-pay is 2016.00. [employer] to contact patient in order to begin financial assistance for co-pay.  Call back # [phone].

## 2015-03-04 NOTE — Telephone Encounter (Signed)
Call placed to [employer] pharmacy to inform them appeal had been approved for Gleevec 400 mg. Approval # is [id number], approved 02/18/15-04/05/16.

## 2015-03-07 NOTE — Telephone Encounter (Signed)
[employer] has contacted to patient regarding co-pay assistance.  They provided the phone number for patient to call and submit his financial information.

## 2015-03-22 ENCOUNTER — Telehealth: Payer: Self-pay | Admitting: Oncology

## 2015-03-22 ENCOUNTER — Other Ambulatory Visit: Payer: Self-pay | Admitting: Oncology

## 2015-03-22 DIAGNOSIS — C49A2 Gastrointestinal stromal tumor of stomach: Secondary | ICD-10-CM

## 2015-03-22 DIAGNOSIS — C49A0 Gastrointestinal stromal tumor, unspecified site: Secondary | ICD-10-CM | POA: Insufficient documentation

## 2015-03-22 MED ORDER — ONDANSETRON HCL 8 MG PO TABS: 8.0000 mg | ORAL_TABLET | Freq: Three times a day (TID) | ORAL | Status: DC | PRN

## 2015-03-22 NOTE — Telephone Encounter (Signed)
Patient has severe nausea and would like something called in that will help him. Thanks.

## 2015-03-25 ENCOUNTER — Other Ambulatory Visit: Payer: Self-pay | Admitting: *Deleted

## 2015-03-25 NOTE — Telephone Encounter (Signed)
Zofran sent to patients pharmacy on Friday, 12/16. I spoke with patient this morning, he states nausea has improved and he is feeling much better.

## 2015-03-27 ENCOUNTER — Inpatient Hospital Stay (HOSPITAL_BASED_OUTPATIENT_CLINIC_OR_DEPARTMENT_OTHER): Payer: Medicare Other | Admitting: Oncology

## 2015-03-27 ENCOUNTER — Inpatient Hospital Stay: Payer: Medicare Other | Attending: Oncology

## 2015-03-27 VITALS — BP 129/80 | HR 87 | Temp 97.7°F | Resp 16 | Wt 149.9 lb

## 2015-03-27 DIAGNOSIS — C49A9 Gastrointestinal stromal tumor of other sites: Secondary | ICD-10-CM | POA: Diagnosis not present

## 2015-03-27 DIAGNOSIS — M129 Arthropathy, unspecified: Secondary | ICD-10-CM | POA: Diagnosis not present

## 2015-03-27 DIAGNOSIS — F419 Anxiety disorder, unspecified: Secondary | ICD-10-CM | POA: Insufficient documentation

## 2015-03-27 DIAGNOSIS — C49A2 Gastrointestinal stromal tumor of stomach: Secondary | ICD-10-CM

## 2015-03-27 DIAGNOSIS — Z79899 Other long term (current) drug therapy: Secondary | ICD-10-CM

## 2015-03-27 DIAGNOSIS — C49A0 Gastrointestinal stromal tumor, unspecified site: Secondary | ICD-10-CM

## 2015-03-27 DIAGNOSIS — R110 Nausea: Secondary | ICD-10-CM | POA: Diagnosis not present

## 2015-03-27 LAB — CBC WITH DIFFERENTIAL/PLATELET
Basophils Absolute: 0.1 10*3/uL (ref 0–0.1)
Basophils Relative: 1 %
EOS ABS: 0.1 10*3/uL (ref 0–0.7)
Eosinophils Relative: 1 %
HCT: 42.4 % (ref 40.0–52.0)
HEMOGLOBIN: 14.6 g/dL (ref 13.0–18.0)
LYMPHS ABS: 1.2 10*3/uL (ref 1.0–3.6)
Lymphocytes Relative: 21 %
MCH: 30.6 pg (ref 26.0–34.0)
MCHC: 34.4 g/dL (ref 32.0–36.0)
MCV: 89 fL (ref 80.0–100.0)
MONOS PCT: 10 %
Monocytes Absolute: 0.5 10*3/uL (ref 0.2–1.0)
NEUTROS PCT: 67 %
Neutro Abs: 3.7 10*3/uL (ref 1.4–6.5)
Platelets: 359 10*3/uL (ref 150–440)
RBC: 4.76 MIL/uL (ref 4.40–5.90)
RDW: 14.4 % (ref 11.5–14.5)
WBC: 5.5 10*3/uL (ref 3.8–10.6)

## 2015-03-27 LAB — COMPREHENSIVE METABOLIC PANEL
ALK PHOS: 163 U/L — AB (ref 38–126)
ALT: 33 U/L (ref 17–63)
ANION GAP: 8 (ref 5–15)
AST: 26 U/L (ref 15–41)
Albumin: 3.6 g/dL (ref 3.5–5.0)
BILIRUBIN TOTAL: 0.5 mg/dL (ref 0.3–1.2)
BUN: 21 mg/dL — ABNORMAL HIGH (ref 6–20)
CALCIUM: 8.4 mg/dL — AB (ref 8.9–10.3)
CO2: 27 mmol/L (ref 22–32)
Chloride: 97 mmol/L — ABNORMAL LOW (ref 101–111)
Creatinine, Ser: 0.9 mg/dL (ref 0.61–1.24)
Glucose, Bld: 115 mg/dL — ABNORMAL HIGH (ref 65–99)
Potassium: 3.6 mmol/L (ref 3.5–5.1)
SODIUM: 132 mmol/L — AB (ref 135–145)
TOTAL PROTEIN: 6.6 g/dL (ref 6.5–8.1)

## 2015-03-27 LAB — PHOSPHORUS: PHOSPHORUS: 3.1 mg/dL (ref 2.5–4.6)

## 2015-03-27 LAB — MAGNESIUM: MAGNESIUM: 2 mg/dL (ref 1.7–2.4)

## 2015-03-27 NOTE — Progress Notes (Signed)
Patient has been taking Gleevac for 6 days and had nausea on day 1 but is tolerating with no problems now.

## 2015-03-27 NOTE — Progress Notes (Signed)
[place]  Telephone:(336) [phone] Fax:(336) [phone]  ID: Curtis Valentine OB: 1945/09/01  MR#: [id number]  [id number]  Patient Care Team: Rusty Aus, MD as PCP - General (Internal Medicine)  CHIEF COMPLAINT:  Chief Complaint  Patient presents with  . GIST    INTERVAL HISTORY: Patient returns to clinic today for further evaluation and to assess his toleration of Gleevec. Patient had increased nausea and poor appetite initially, but this has resolved since having a prescription for Zofran. He currently feels well. He continues to be highly anxious, but otherwise feels well.  He has no neurologic complaints.  He denies any recent fevers or illnesses. He has a good appetite and denies weight loss.  He denies any pain.  He denies any vomiting, constipation, or diarrhea.  He denies any melena or hematochezia.  He has no urinary complaints.  Patient offers no further specific complaints today.   REVIEW OF SYSTEMS:   Review of Systems  Constitutional: Negative.   Respiratory: Negative.   Cardiovascular: Negative.   Gastrointestinal: Positive for nausea. Negative for vomiting, abdominal pain, diarrhea, constipation, blood in stool and melena.  Neurological: Negative.   Psychiatric/Behavioral: The patient is nervous/anxious.     As per HPI. Otherwise, a complete review of systems is negatve.  PAST MEDICAL HISTORY: Past Medical History  Diagnosis Date  . Cancer [facility])     GIST 2011 per pt  . Arthritis     PAST SURGICAL HISTORY: No past surgical history on file.  FAMILY HISTORY: Reviewed and unchanged. No reported history of malignancy or chronic disease.     ADVANCED DIRECTIVES:    HEALTH MAINTENANCE: Social History  Substance Use Topics  . Smoking status: Never Smoker   . Smokeless tobacco: Never Used  . Alcohol Use: Not on file     Comment: wine-occasional     Colonoscopy:  PAP:  Bone density:  Lipid panel:  Allergies  Allergen Reactions    . Morphine And Related Other (See Comments)    Very confused.   Marland Kitchen Penicillins Hives    Itch and breakout in hives.    Current Outpatient Prescriptions  Medication Sig Dispense Refill  . celecoxib (CELEBREX) 200 MG capsule Take 200 mg by mouth daily.    Marland Kitchen esomeprazole (NEXIUM) 40 MG capsule Take 40 mg by mouth daily at 12 noon.    . imatinib (GLEEVEC) 400 MG tablet Take 1 tablet (400 mg total) by mouth daily. Take with meals and large glass of water.Caution:Chemotherapy. 30 tablet 5  . LORazepam (ATIVAN) 0.5 MG tablet Take 0.5 mg by mouth every 8 (eight) hours as needed for anxiety.    . ondansetron (ZOFRAN) 8 MG tablet Take 1 tablet (8 mg total) by mouth every 8 (eight) hours as needed for nausea or vomiting. 60 tablet 2  . imatinib (GLEEVEC) 400 MG tablet Take 1 tablet (400 mg total) by mouth daily. Take with meals and large glass of water.Caution:Chemotherapy. (Patient not taking: Reported on 03/27/2015) 30 tablet 5  . imatinib (GLEEVEC) 400 MG tablet Take 1 tablet (400 mg total) by mouth daily. Take with meals and large glass of water.Caution:Chemotherapy. (Patient not taking: Reported on 03/27/2015) 30 tablet 5   No current facility-administered medications for this visit.    OBJECTIVE: Filed Vitals:   03/27/15 0931  BP: 129/80  Pulse: 87  Temp: 97.7 F (36.5 C)  Resp: 16     Body mass index is 22.8 kg/(m^2).    ECOG FS:0 - Asymptomatic  General: Well-developed, well-nourished, no acute distress. Eyes: Pink conjunctiva, anicteric sclera. Lungs: Clear to auscultation bilaterally. Heart: Regular rate and rhythm. No rubs, murmurs, or gallops. Abdomen: Soft, nontender, nondistended. No organomegaly noted, normoactive bowel sounds. No palpable masses. Musculoskeletal: No edema, cyanosis, or clubbing. Neuro: Alert, answering all questions appropriately. Cranial nerves grossly intact. Skin: No rashes or petechiae noted. Psych: Normal affect.   LAB RESULTS:  Lab Results   Component Value Date   NA 132* 03/27/2015   K 3.6 03/27/2015   CL 97* 03/27/2015   CO2 27 03/27/2015   GLUCOSE 115* 03/27/2015   BUN 21* 03/27/2015   CREATININE 0.90 03/27/2015   CALCIUM 8.4* 03/27/2015   PROT 6.6 03/27/2015   ALBUMIN 3.6 03/27/2015   AST 26 03/27/2015   ALT 33 03/27/2015   ALKPHOS 163* 03/27/2015   BILITOT 0.5 03/27/2015   GFRNONAA >60 03/27/2015   GFRAA >60 03/27/2015    Lab Results  Component Value Date   WBC 5.5 03/27/2015   NEUTROABS 3.7 03/27/2015   HGB 14.6 03/27/2015   HCT 42.4 03/27/2015   MCV 89.0 03/27/2015   PLT 359 03/27/2015     STUDIES: No results found.  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  PLAN:    1. GIST: See clinic note dated February 16, 2013 for detailed history of his disease process.  CT scan results reviewed independently with persistent heterogeneous mass near the head of the pancreas. Biopsy results confirm recurrent GIST. Patient is not a surgical candidate. Continue 400 mg Gleevec daily until intolerable side effects or progression of disease. Of note, he did not receive Gleevec adjuvantly when he was originally diagnosed in February 2011. Return to clinic in 6 weeks for repeat laboratory work and to assess his toleration of [place]. 2. Anxiety: Continue Ativan 0.5 mg 3 times a day as needed. 3. Nausea: Continue ondansetron as needed.  Approximately 30 minutes was spent in discussion of which greater than 50% was consultation.  Patient expressed understanding and was in agreement with this plan. He also understands that He can call clinic at any time with any questions, concerns, or complaints.    Lloyd Huger, MD   04/07/2015 9:32 AM

## 2015-04-22 ENCOUNTER — Telehealth: Payer: Self-pay | Admitting: *Deleted

## 2015-04-22 DIAGNOSIS — R8290 Unspecified abnormal findings in urine: Secondary | ICD-10-CM

## 2015-04-22 NOTE — Telephone Encounter (Signed)
Having daily nausea, but med helps, Has abdominal bloating, having nml BM's, Bil ankle swelling, Urine is cloudy and foul smelling, would like to have urine checked when he comes in on the 6th. Denies fever.  Advised to increase water intake and that I would check with MD tomorrow. He was also advised to keep his feet elevated which he said he is trying to do. Advised to take nausea med at first sign of nausea since nausea does not occur at some time every day. He statse he has only vomited twice in the past month adn that he is taking med after he eats a bowl of cereal

## 2015-04-23 ENCOUNTER — Other Ambulatory Visit: Payer: Self-pay | Admitting: *Deleted

## 2015-04-23 ENCOUNTER — Encounter: Payer: Self-pay | Admitting: Oncology

## 2015-04-23 ENCOUNTER — Inpatient Hospital Stay (HOSPITAL_BASED_OUTPATIENT_CLINIC_OR_DEPARTMENT_OTHER): Payer: Medicare Other | Admitting: Oncology

## 2015-04-23 ENCOUNTER — Inpatient Hospital Stay: Payer: Medicare Other | Attending: Oncology

## 2015-04-23 VITALS — BP 122/78 | HR 97 | Temp 97.0°F | Resp 20 | Ht 68.0 in | Wt 161.6 lb

## 2015-04-23 DIAGNOSIS — F419 Anxiety disorder, unspecified: Secondary | ICD-10-CM | POA: Insufficient documentation

## 2015-04-23 DIAGNOSIS — C49A0 Gastrointestinal stromal tumor, unspecified site: Secondary | ICD-10-CM

## 2015-04-23 DIAGNOSIS — R110 Nausea: Secondary | ICD-10-CM

## 2015-04-23 DIAGNOSIS — Z79899 Other long term (current) drug therapy: Secondary | ICD-10-CM | POA: Diagnosis not present

## 2015-04-23 DIAGNOSIS — R188 Other ascites: Secondary | ICD-10-CM | POA: Insufficient documentation

## 2015-04-23 DIAGNOSIS — R17 Unspecified jaundice: Secondary | ICD-10-CM

## 2015-04-23 DIAGNOSIS — N400 Enlarged prostate without lower urinary tract symptoms: Secondary | ICD-10-CM | POA: Insufficient documentation

## 2015-04-23 DIAGNOSIS — C49A2 Gastrointestinal stromal tumor of stomach: Secondary | ICD-10-CM | POA: Diagnosis present

## 2015-04-23 DIAGNOSIS — K439 Ventral hernia without obstruction or gangrene: Secondary | ICD-10-CM | POA: Insufficient documentation

## 2015-04-23 DIAGNOSIS — R600 Localized edema: Secondary | ICD-10-CM | POA: Diagnosis not present

## 2015-04-23 DIAGNOSIS — R748 Abnormal levels of other serum enzymes: Secondary | ICD-10-CM | POA: Insufficient documentation

## 2015-04-23 DIAGNOSIS — N5089 Other specified disorders of the male genital organs: Secondary | ICD-10-CM | POA: Insufficient documentation

## 2015-04-23 DIAGNOSIS — R1900 Intra-abdominal and pelvic swelling, mass and lump, unspecified site: Secondary | ICD-10-CM | POA: Diagnosis not present

## 2015-04-23 DIAGNOSIS — M129 Arthropathy, unspecified: Secondary | ICD-10-CM

## 2015-04-23 DIAGNOSIS — R109 Unspecified abdominal pain: Secondary | ICD-10-CM | POA: Diagnosis not present

## 2015-04-23 DIAGNOSIS — R8290 Unspecified abnormal findings in urine: Secondary | ICD-10-CM

## 2015-04-23 LAB — CBC WITH DIFFERENTIAL/PLATELET
BASOS PCT: 1 %
Basophils Absolute: 0.1 10*3/uL (ref 0–0.1)
EOS ABS: 0 10*3/uL (ref 0–0.7)
EOS PCT: 1 %
HCT: 34.6 % — ABNORMAL LOW (ref 40.0–52.0)
HEMOGLOBIN: 11.8 g/dL — AB (ref 13.0–18.0)
LYMPHS ABS: 0.7 10*3/uL — AB (ref 1.0–3.6)
Lymphocytes Relative: 14 %
MCH: 30.8 pg (ref 26.0–34.0)
MCHC: 34 g/dL (ref 32.0–36.0)
MCV: 90.6 fL (ref 80.0–100.0)
MONO ABS: 0.5 10*3/uL (ref 0.2–1.0)
MONOS PCT: 11 %
NEUTROS PCT: 73 %
Neutro Abs: 3.6 10*3/uL (ref 1.4–6.5)
Platelets: 324 10*3/uL (ref 150–440)
RBC: 3.82 MIL/uL — ABNORMAL LOW (ref 4.40–5.90)
RDW: 17.6 % — AB (ref 11.5–14.5)
WBC: 4.9 10*3/uL (ref 3.8–10.6)

## 2015-04-23 LAB — URINALYSIS COMPLETE WITH MICROSCOPIC (ARMC ONLY)
Bacteria, UA: NONE SEEN
Bilirubin Urine: NEGATIVE
GLUCOSE, UA: NEGATIVE mg/dL
Hgb urine dipstick: NEGATIVE
Ketones, ur: NEGATIVE mg/dL
Leukocytes, UA: NEGATIVE
Nitrite: NEGATIVE
PROTEIN: NEGATIVE mg/dL
RBC / HPF: NONE SEEN RBC/hpf (ref 0–5)
SPECIFIC GRAVITY, URINE: 1.002 — AB (ref 1.005–1.030)
SQUAMOUS EPITHELIAL / LPF: NONE SEEN
pH: 7 (ref 5.0–8.0)

## 2015-04-23 LAB — COMPREHENSIVE METABOLIC PANEL
ALBUMIN: 3.4 g/dL — AB (ref 3.5–5.0)
ALK PHOS: 596 U/L — AB (ref 38–126)
ALT: 376 U/L — AB (ref 17–63)
AST: 323 U/L — AB (ref 15–41)
Anion gap: 5 (ref 5–15)
BUN: 17 mg/dL (ref 6–20)
CALCIUM: 8.4 mg/dL — AB (ref 8.9–10.3)
CHLORIDE: 99 mmol/L — AB (ref 101–111)
CO2: 29 mmol/L (ref 22–32)
CREATININE: 0.91 mg/dL (ref 0.61–1.24)
GFR calc non Af Amer: >60 mL/min (ref >60)
GLUCOSE: 123 mg/dL — AB (ref 65–99)
Potassium: 3.1 mmol/L — ABNORMAL LOW (ref 3.5–5.1)
SODIUM: 133 mmol/L — AB (ref 135–145)
Total Bilirubin: 3.8 mg/dL — ABNORMAL HIGH (ref 0.3–1.2)
Total Protein: 6 g/dL — ABNORMAL LOW (ref 6.5–8.1)

## 2015-04-23 MED ORDER — FUROSEMIDE 20 MG PO TABS: 20.0000 mg | ORAL_TABLET | Freq: Every day | ORAL | Status: DC

## 2015-04-23 NOTE — Progress Notes (Signed)
Patient here today for nausea that started on Sunday. Swelling bilaterally in lower extremities. Abdomen distended. States his urine is cloudy. Denies any pain or bleeding.

## 2015-04-23 NOTE — Telephone Encounter (Signed)
Pt came in for urine sample, He has 2 -3 + PE bil lower extremities, l more than right, He reports scrotal swelling. His abd is distended, he looks to be 7 months pregnant. He appears to be jaundiced also. I spoke with Dr Grayland Ormond who has asked for labs to be drawn and then to see pt after labs drawn. Pt in agreement with this plan

## 2015-04-23 NOTE — Telephone Encounter (Signed)
Have him come in for UA / C&S and not wait until the 6th. Have him take nausea med every morning on a regular basis. Discussed with patient who will try to get a ride.  States swelling is hard to define. He can wear some shoes, others he cannot get on. He said "they are tight " when I asked him to push on his shins to see how far in it sinks. He is tearful because he is not able to eat much and he vomiied last night. He will call me to let me know when he can come in for urine sample

## 2015-04-24 LAB — URINE CULTURE: Culture: 2000

## 2015-04-25 ENCOUNTER — Ambulatory Visit
Admission: RE | Admit: 2015-04-25 | Discharge: 2015-04-25 | Disposition: A | Discharge status: Home / Self Care | Payer: Medicare Other | Source: Ambulatory Visit | Attending: Oncology | Admitting: Oncology

## 2015-04-25 DIAGNOSIS — R188 Other ascites: Secondary | ICD-10-CM | POA: Insufficient documentation

## 2015-04-25 DIAGNOSIS — C786 Secondary malignant neoplasm of retroperitoneum and peritoneum: Secondary | ICD-10-CM | POA: Insufficient documentation

## 2015-04-25 DIAGNOSIS — C49A2 Gastrointestinal stromal tumor of stomach: Secondary | ICD-10-CM | POA: Diagnosis not present

## 2015-04-25 MED ORDER — IOHEXOL 300 MG/ML  SOLN
100.0000 mL | Freq: Once | INTRAMUSCULAR | Status: AC | PRN
  Administered 2015-04-25: 100 mL via INTRAVENOUS

## 2015-04-25 NOTE — Progress Notes (Signed)
[place]  Telephone:(336) [phone] Fax:(336) [phone]  ID: Curtis Valentine OB: Aug 23, 1945  MR#: [id number]  [id number]  Patient Care Team: Rusty Aus, MD as PCP - General (Internal Medicine)  CHIEF COMPLAINT:  Chief Complaint  Patient presents with  . GIST  . Nausea, abdominal distention    INTERVAL HISTORY: Patient returns to clinic today as an add-on with complaints of increasing nausea, jaundice, abdominal bloating as well as scrotal and lower extremity edema. He continues to be highly anxious, but otherwise feels well.  He has no neurologic complaints.  He denies any recent fevers or illnesses. He has a good appetite and denies weight loss.  He denies any pain.  He denies any vomiting, constipation, or diarrhea.  He denies any melena or hematochezia.  He has no urinary complaints.  Patient offers no further specific complaints today.   REVIEW OF SYSTEMS:   Review of Systems  Constitutional: Negative.   Respiratory: Negative.  Negative for shortness of breath.   Cardiovascular: Positive for leg swelling. Negative for chest pain.  Gastrointestinal: Positive for nausea and abdominal pain. Negative for vomiting, diarrhea, constipation, blood in stool and melena.  Genitourinary:       Scrotal edema  Neurological: Negative.   Psychiatric/Behavioral: The patient is nervous/anxious.     As per HPI. Otherwise, a complete review of systems is negatve.  PAST MEDICAL HISTORY: Past Medical History  Diagnosis Date  . Cancer [facility])     GIST 2011 per pt  . Arthritis   . GIST (gastrointestinal stroma tumor), malignant, colon ([place])     PAST SURGICAL HISTORY: History reviewed. No pertinent past surgical history.  FAMILY HISTORY: Reviewed and unchanged. No reported history of malignancy or chronic disease.     ADVANCED DIRECTIVES:    HEALTH MAINTENANCE: Social History  Substance Use Topics  . Smoking status: Never Smoker   . Smokeless tobacco: Never Used   . Alcohol Use: Yes     Comment: wine-occasional     Colonoscopy:  PAP:  Bone density:  Lipid panel:  Allergies  Allergen Reactions  . Morphine And Related Other (See Comments)    Very confused.   Marland Kitchen Penicillins Hives    Itch and breakout in hives.    Current Outpatient Prescriptions  Medication Sig Dispense Refill  . celecoxib (CELEBREX) 200 MG capsule Take 200 mg by mouth daily.    Marland Kitchen esomeprazole (NEXIUM) 40 MG capsule Take 40 mg by mouth daily at 12 noon.    . imatinib (GLEEVEC) 400 MG tablet Take 1 tablet (400 mg total) by mouth daily. Take with meals and large glass of water.Caution:Chemotherapy. 30 tablet 5  . imatinib (GLEEVEC) 400 MG tablet Take 1 tablet (400 mg total) by mouth daily. Take with meals and large glass of water.Caution:Chemotherapy. 30 tablet 5  . LORazepam (ATIVAN) 0.5 MG tablet Take 0.5 mg by mouth every 8 (eight) hours as needed for anxiety.    . ondansetron (ZOFRAN) 8 MG tablet Take 1 tablet (8 mg total) by mouth every 8 (eight) hours as needed for nausea or vomiting. 60 tablet 2  . furosemide (LASIX) 20 MG tablet Take 1 tablet (20 mg total) by mouth daily. 30 tablet 2   No current facility-administered medications for this visit.    OBJECTIVE: Filed Vitals:   04/23/15 1410  BP: 122/78  Pulse: 97  Temp: 97 F (36.1 C)  Resp: 20     Body mass index is 24.58 kg/(m^2).    ECOG FS:1 -  Symptomatic but completely ambulatory  General: Well-developed, well-nourished, no acute distress. Eyes: Pink conjunctiva, mildly icteric sclera. Lungs: Clear to auscultation bilaterally. Heart: Regular rate and rhythm. No rubs, murmurs, or gallops. Abdomen: Mildly distended. No palpable masses. Musculoskeletal: 2-3+ bilateral lower extremity edema. Neuro: Alert, answering all questions appropriately. Cranial nerves grossly intact. Skin: Mild jaundice. Psych: Normal affect.   LAB RESULTS:  Lab Results  Component Value Date   NA 133* 04/23/2015   K 3.1*  04/23/2015   CL 99* 04/23/2015   CO2 29 04/23/2015   GLUCOSE 123* 04/23/2015   BUN 17 04/23/2015   CREATININE 0.91 04/23/2015   CALCIUM 8.4* 04/23/2015   PROT 6.0* 04/23/2015   ALBUMIN 3.4* 04/23/2015   AST 323* 04/23/2015   ALT 376* 04/23/2015   ALKPHOS 596* 04/23/2015   BILITOT 3.8* 04/23/2015   GFRNONAA >60 04/23/2015   GFRAA >60 04/23/2015    Lab Results  Component Value Date   WBC 4.9 04/23/2015   NEUTROABS 3.6 04/23/2015   HGB 11.8* 04/23/2015   HCT 34.6* 04/23/2015   MCV 90.6 04/23/2015   PLT 324 04/23/2015     STUDIES: No results found.  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  PLAN:    1. GIST: See clinic note dated February 16, 2013 for detailed history of his disease process.  CT scan results reviewed independently with persistent heterogeneous mass near the head of the pancreas. Biopsy results confirm recurrent GIST. Patient is not a surgical candidate. He has been instructed to hold [place] until his liver enzymes resolved.  2. Anxiety: Continue Ativan 0.5 mg 3 times a day as needed. 3. Nausea: Continue ondansetron as needed. 4. Elevated liver enzymes: Unclear etiology. Possibly secondary to progression of disease, Gleevec, or another etiology undetermined. Will get CT scan of the abdomen and pelvis for further evaluation. Return to clinic in 1 week for repeat laboratory work and discussion of his imaging results.  Approximately 30 minutes was spent in discussion of which greater than 50% was consultation.  Patient expressed understanding and was in agreement with this plan. He also understands that He can call clinic at any time with any questions, concerns, or complaints.    Lloyd Huger, MD   04/25/2015 9:04 AM

## 2015-04-30 ENCOUNTER — Inpatient Hospital Stay: Payer: Medicare Other

## 2015-04-30 ENCOUNTER — Inpatient Hospital Stay (HOSPITAL_BASED_OUTPATIENT_CLINIC_OR_DEPARTMENT_OTHER): Payer: Medicare Other | Admitting: Oncology

## 2015-04-30 VITALS — BP 116/76 | HR 86 | Temp 98.1°F | Resp 16 | Wt 157.2 lb

## 2015-04-30 DIAGNOSIS — R110 Nausea: Secondary | ICD-10-CM

## 2015-04-30 DIAGNOSIS — N400 Enlarged prostate without lower urinary tract symptoms: Secondary | ICD-10-CM

## 2015-04-30 DIAGNOSIS — R600 Localized edema: Secondary | ICD-10-CM

## 2015-04-30 DIAGNOSIS — F419 Anxiety disorder, unspecified: Secondary | ICD-10-CM

## 2015-04-30 DIAGNOSIS — R1900 Intra-abdominal and pelvic swelling, mass and lump, unspecified site: Secondary | ICD-10-CM

## 2015-04-30 DIAGNOSIS — Z79899 Other long term (current) drug therapy: Secondary | ICD-10-CM

## 2015-04-30 DIAGNOSIS — M129 Arthropathy, unspecified: Secondary | ICD-10-CM

## 2015-04-30 DIAGNOSIS — C49A2 Gastrointestinal stromal tumor of stomach: Secondary | ICD-10-CM

## 2015-04-30 DIAGNOSIS — R188 Other ascites: Secondary | ICD-10-CM | POA: Diagnosis not present

## 2015-04-30 DIAGNOSIS — R109 Unspecified abdominal pain: Secondary | ICD-10-CM

## 2015-04-30 DIAGNOSIS — K439 Ventral hernia without obstruction or gangrene: Secondary | ICD-10-CM | POA: Diagnosis not present

## 2015-04-30 DIAGNOSIS — R748 Abnormal levels of other serum enzymes: Secondary | ICD-10-CM

## 2015-04-30 DIAGNOSIS — N5089 Other specified disorders of the male genital organs: Secondary | ICD-10-CM

## 2015-04-30 DIAGNOSIS — R17 Unspecified jaundice: Secondary | ICD-10-CM

## 2015-04-30 LAB — COMPREHENSIVE METABOLIC PANEL
ALBUMIN: 3.3 g/dL — AB (ref 3.5–5.0)
ALT: 286 U/L — ABNORMAL HIGH (ref 17–63)
ANION GAP: 3 — AB (ref 5–15)
AST: 218 U/L — ABNORMAL HIGH (ref 15–41)
Alkaline Phosphatase: 804 U/L — ABNORMAL HIGH (ref 38–126)
BILIRUBIN TOTAL: 4.9 mg/dL — AB (ref 0.3–1.2)
BUN: 18 mg/dL (ref 6–20)
CALCIUM: 8.6 mg/dL — AB (ref 8.9–10.3)
CHLORIDE: 99 mmol/L — AB (ref 101–111)
CO2: 35 mmol/L — AB (ref 22–32)
CREATININE: 0.73 mg/dL (ref 0.61–1.24)
GFR calc Af Amer: >60 mL/min (ref >60)
GFR calc non Af Amer: >60 mL/min (ref >60)
GLUCOSE: 110 mg/dL — AB (ref 65–99)
POTASSIUM: 3.6 mmol/L (ref 3.5–5.1)
SODIUM: 137 mmol/L (ref 135–145)
Total Protein: 6.3 g/dL — ABNORMAL LOW (ref 6.5–8.1)

## 2015-04-30 LAB — CBC WITH DIFFERENTIAL/PLATELET
BASOS PCT: 1 %
Basophils Absolute: 0 10*3/uL (ref 0–0.1)
EOS ABS: 0.1 10*3/uL (ref 0–0.7)
EOS PCT: 2 %
HCT: 34.9 % — ABNORMAL LOW (ref 40.0–52.0)
HEMOGLOBIN: 11.8 g/dL — AB (ref 13.0–18.0)
LYMPHS ABS: 0.8 10*3/uL — AB (ref 1.0–3.6)
Lymphocytes Relative: 23 %
MCH: 31.6 pg (ref 26.0–34.0)
MCHC: 33.9 g/dL (ref 32.0–36.0)
MCV: 93.2 fL (ref 80.0–100.0)
MONO ABS: 0.6 10*3/uL (ref 0.2–1.0)
MONOS PCT: 19 %
Neutro Abs: 1.9 10*3/uL (ref 1.4–6.5)
Neutrophils Relative %: 55 %
PLATELETS: 334 10*3/uL (ref 150–440)
RBC: 3.74 MIL/uL — ABNORMAL LOW (ref 4.40–5.90)
RDW: 19 % — AB (ref 11.5–14.5)
WBC: 3.4 10*3/uL — ABNORMAL LOW (ref 3.8–10.6)

## 2015-04-30 NOTE — Progress Notes (Signed)
Patient is feeling better today and states he is "feeling like himself again".

## 2015-05-03 NOTE — Progress Notes (Signed)
[place]  [phone] [phone]  ID: Curtis Valentine OB: [birth year]  MR#: [id number]  [id number]  Patient Care Team: Rusty Aus, MD as PCP - General (Internal Medicine)  CHIEF COMPLAINT:  Chief Complaint  Patient presents with  . GIST    INTERVAL HISTORY: Patient returns to clinic today for further evaluation and discussion of his imaging results. He currently feels well and nearly back to his baseline since discontinuing Gleevec. He continues to have abdominal bloating, but denies any further nausea. His edema has improved. He continues to be highly anxious, but otherwise feels well.  He has no neurologic complaints.  He denies any recent fevers or illnesses. He has a fair  appetite and denies weight loss.  He denies any pain.  He denies any vomiting, constipation, or diarrhea.  He denies any melena or hematochezia.  He has no urinary complaints.  Patient offers no further specific complaints today.   REVIEW OF SYSTEMS:   Review of Systems  Constitutional: Negative.   Respiratory: Negative.  Negative for shortness of breath.   Cardiovascular: Positive for leg swelling. Negative for chest pain.  Gastrointestinal: Positive for nausea and abdominal pain. Negative for vomiting, diarrhea, constipation, blood in stool and melena.  Genitourinary:       Scrotal edema  Neurological: Negative.   Psychiatric/Behavioral: The patient is nervous/anxious.     As per HPI. Otherwise, a complete review of systems is negatve.  PAST MEDICAL HISTORY: Past Medical History  Diagnosis Date  . 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 Arthritis   . GIST (gastrointestinal stroma tumor), malignant, colon ([place])     PAST SURGICAL HISTORY: No past surgical history on file.  FAMILY HISTORY: Reviewed and unchanged. No reported history of malignancy or chronic disease.     ADVANCED DIRECTIVES:    HEALTH MAINTENANCE: Social History  Substance Use Topics    . Smoking status: Never Smoker   . Smokeless tobacco: Never Used  . Alcohol Use: Yes     Comment: wine-occasional     Colonoscopy:  PAP:  Bone density:  Lipid panel:  Allergies  Allergen Reactions  . Morphine And Related Other (See Comments)    Very confused.   Marland Kitchen Penicillins Hives    Itch and breakout in hives.    Current Outpatient Prescriptions  Medication Sig Dispense Refill  . celecoxib (CELEBREX) 200 MG capsule Take 200 mg by mouth daily.    Marland Kitchen esomeprazole (NEXIUM) 40 MG capsule Take 40 mg by mouth daily at 12 noon.    . furosemide (LASIX) 20 MG tablet Take 1 tablet (20 mg total) by mouth daily. 30 tablet 2  . imatinib (GLEEVEC) 400 MG tablet Take 1 tablet (400 mg total) by mouth daily. Take with meals and large glass of water.Caution:Chemotherapy. 30 tablet 5  . imatinib (GLEEVEC) 400 MG tablet Take 1 tablet (400 mg total) by mouth daily. Take with meals and large glass of water.Caution:Chemotherapy. 30 tablet 5  . LORazepam (ATIVAN) 0.5 MG tablet Take 0.5 mg by mouth every 8 (eight) hours as needed for anxiety.    . ondansetron (ZOFRAN) 8 MG tablet Take 1 tablet (8 mg total) by mouth every 8 (eight) hours as needed for nausea or vomiting. 60 tablet 2   No current facility-administered medications for this visit.    OBJECTIVE: Filed Vitals:   04/30/15 1519  BP: 116/76  Pulse: 86  Temp: 98.1 F (36.7 C)  Resp: 16  Body mass index is 23.91 kg/(m^2).    ECOG FS:1 - Symptomatic but completely ambulatory  General: Well-developed, well-nourished, no acute distress. Eyes: Pink conjunctiva, mildly icteric sclera. Lungs: Clear to auscultation bilaterally. Heart: Regular rate and rhythm. No rubs, murmurs, or gallops. Abdomen: Mildly distended. No palpable masses. Musculoskeletal: 2-3+ bilateral lower extremity edema. Neuro: Alert, answering all questions appropriately. Cranial nerves grossly intact. Skin: Mild jaundice. Psych: Normal affect.   LAB RESULTS:  Lab  Results  Component Value Date   NA 137 04/30/2015   K 3.6 04/30/2015   CL 99* 04/30/2015   CO2 35* 04/30/2015   GLUCOSE 110* 04/30/2015   BUN 18 04/30/2015   CREATININE 0.73 04/30/2015   CALCIUM 8.6* 04/30/2015   PROT 6.3* 04/30/2015   ALBUMIN 3.3* 04/30/2015   AST 218* 04/30/2015   ALT 286* 04/30/2015   ALKPHOS 804* 04/30/2015   BILITOT 4.9* 04/30/2015   GFRNONAA >60 04/30/2015   GFRAA >60 04/30/2015    Lab Results  Component Value Date   WBC 3.4* 04/30/2015   NEUTROABS 1.9 04/30/2015   HGB 11.8* 04/30/2015   HCT 34.9* 04/30/2015   MCV 93.2 04/30/2015   PLT 334 04/30/2015     STUDIES: Ct Abdomen Pelvis W Contrast  04/25/2015  CLINICAL DATA:  Malignant GI stromal tumor. Worsening nausea and abdominal bloating for 4 days. Undergoing chemotherapy. Elevated liver function tests. EXAM: CT ABDOMEN AND PELVIS WITH CONTRAST TECHNIQUE: Multidetector CT imaging of the abdomen and pelvis was performed using the standard protocol following bolus administration of intravenous contrast. CONTRAST:  125mL OMNIPAQUE IOHEXOL 300 MG/ML  SOLN COMPARISON:  Abdomen CT on 01/24/2015 and pelvis CT on 12/24/2014 FINDINGS: Lower chest:  No acute findings. Hepatobiliary: Large heterogeneously enhancing soft tissue mass in the right abdomen and porta hepatis shows significant increase in size since previous study. This currently measures 17.8 x 13.0 cm on image 36 compared to 8.7 x 9.5 cm previously. new mild dilatation of intrahepatic bile ducts is seen since previous study. Surgical clips again demonstrate from prior cholecystectomy. No internal liver masses are identified. Pancreas: No mass, inflammatory changes, or other significant abnormality. Spleen: Unremarkable. Adrenals/Urinary Tract: No masses identified. No evidence of hydronephrosis. Stomach/Bowel: No evidence of obstruction, inflammatory process, or abnormal fluid collections. Diverticulosis again seen involving the descending and sigmoid colon,  without evidence of diverticulitis. Vascular/Lymphatic: No pathologically enlarged lymph nodes. No evidence of abdominal aortic aneurysm. Reproductive: Stable moderately enlarged prostate. Other: Numerous peritoneal soft tissue nodules and masses are seen within the upper abdomen in the gastrohepatic ligament, porta hepatis, gastrohepatic and gastrosplenic ligaments and omentum, consistent with peritoneal metastases. The show mild increase in size and number since previous study. Index nodule just deep to the umbilicus measures 2.9 x 3.3 cm on image 42 compared to 2.2 x 3.1 cm previously. Mild ascites is also new since previous study. Moderate periumbilical ventral hernia containing only fat is stable. Musculoskeletal:  No suspicious bone lesions identified. IMPRESSION: Significant increase in size of large right upper quadrant abdominal mass. Mild increase in diffuse peritoneal metastatic disease mainly within the abdomen, and new mild ascites. New mild diffuse intrahepatic biliary ductal dilatation due to large right upper quadrant mass. Electronically Signed   By: Earle Gell M.D.   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  PLAN:    1. GIST: See clinic note dated February 16, 2013 for detailed history of his disease process.  CT scan results reviewed independently  with significant increase in his known heterogeneous mass near the head of the pancreas likely invading the liver. Patient is not a surgical candidate. Continue to hold [place] until his liver enzymes resolved. Interventional radiology will attempt a percutaneous bile duct cannulation in an attempt to improve his bilirubin, although they admit this is unlikely. If this is not possible, hospice may be patient's only option. 2. Anxiety: Continue Ativan 0.5 mg 3 times a day as needed. 3. Nausea: Continue ondansetron as needed. 4. Elevated liver enzymes: Slightly improved since  discontinuing the fact. Although progression of disease and also be affecting the elevation of liver enzymes. Continue to hold [place]. 5. Elevated bilirubin: Likely secondary to progression of disease. Percutaneous cannulation as above.   Approximately 30 minutes was spent in discussion of which greater than 50% was consultation.  Patient expressed understanding and was in agreement with this plan. He also understands that He can call clinic at any time with any questions, concerns, or complaints.    Lloyd Huger, MD   05/03/2015 12:04 PM

## 2015-05-06 ENCOUNTER — Other Ambulatory Visit: Payer: Self-pay | Admitting: Oncology

## 2015-05-06 DIAGNOSIS — C49A0 Gastrointestinal stromal tumor, unspecified site: Secondary | ICD-10-CM

## 2015-05-08 ENCOUNTER — Telehealth: Payer: Self-pay | Admitting: *Deleted

## 2015-05-08 ENCOUNTER — Telehealth: Payer: Self-pay

## 2015-05-08 NOTE — Telephone Encounter (Signed)
Call returned to patient regarding concerns about scheduling of stent placement in Interventional Radiology. I informed patient that this procedure had to be reviewed by the IR nurse prior to scheduling and that she would be back in the office Wednesday morning and procedure would be scheduled. Per scheduling we should be able to have an appointment for the procedure by Wednesday am.

## 2015-05-08 NOTE — Telephone Encounter (Signed)
Call placed to [facility] in special scheduling regarding procedure that had not yet been scheduled for patient. Bethena Roys states that [place] has the orders and procedure will be scheduled.

## 2015-05-08 NOTE — Telephone Encounter (Signed)
Left message with special scheduling regarding patient being scheduled for procedure, as of this time we still do not have appointment for procedure.

## 2015-05-08 NOTE — Telephone Encounter (Signed)
Patient was upset that he has not received a call with a scheduled date or time for his stent placement procedure.  I explained to the patient that I'm not comfortable enough with the processes of the procedure scheduling to comment on why or when the procedure will be scheduled but I would call scheduling to check on the status.  I called and spoke with Pamala Hurry in specials procedure scheduling and they did confirm that Curtis Valentine did receive the order for stent placement and I also confirmed they have our contact numbers and they will call us as soon as it is scheduled.  Called Curtis Valentine back and explained to him that the order has been received and we will call him as soon as it is scheduled but he did not seemed satisfied with this.

## 2015-05-08 NOTE — Telephone Encounter (Signed)
States he has great concern that his stent placement has not been scheduled and that he feels he has been lied to as well. He would like to speak with Dr Grayland Ormond, NOT a nurse to see what kind of effect this delay is having on his health. Please call him at [phone]

## 2015-05-10 ENCOUNTER — Ambulatory Visit
Admission: RE | Admit: 2015-05-10 | Discharge: 2015-05-10 | Disposition: A | Discharge status: Home / Self Care | Payer: Medicare Other | Source: Ambulatory Visit | Attending: Diagnostic Radiology | Admitting: Diagnostic Radiology

## 2015-05-10 ENCOUNTER — Ambulatory Visit
Admission: RE | Admit: 2015-05-10 | Discharge: 2015-05-10 | Disposition: A | Discharge status: Home / Self Care | Payer: Medicare Other | Source: Ambulatory Visit | Attending: Oncology | Admitting: Oncology

## 2015-05-10 DIAGNOSIS — Z79899 Other long term (current) drug therapy: Secondary | ICD-10-CM | POA: Diagnosis not present

## 2015-05-10 DIAGNOSIS — R748 Abnormal levels of other serum enzymes: Secondary | ICD-10-CM | POA: Diagnosis not present

## 2015-05-10 DIAGNOSIS — M1990 Unspecified osteoarthritis, unspecified site: Secondary | ICD-10-CM | POA: Diagnosis not present

## 2015-05-10 DIAGNOSIS — Z5309 Procedure and treatment not carried out because of other contraindication: Secondary | ICD-10-CM | POA: Diagnosis not present

## 2015-05-10 DIAGNOSIS — Z85038 Personal history of other malignant neoplasm of large intestine: Secondary | ICD-10-CM | POA: Insufficient documentation

## 2015-05-10 DIAGNOSIS — Z880 Allergy status to penicillin: Secondary | ICD-10-CM | POA: Insufficient documentation

## 2015-05-10 DIAGNOSIS — K831 Obstruction of bile duct: Secondary | ICD-10-CM

## 2015-05-10 DIAGNOSIS — C49A0 Gastrointestinal stromal tumor, unspecified site: Secondary | ICD-10-CM

## 2015-05-10 DIAGNOSIS — R188 Other ascites: Secondary | ICD-10-CM

## 2015-05-10 LAB — CBC
HEMATOCRIT: 35.1 % — AB (ref 40.0–52.0)
Hemoglobin: 11.7 g/dL — ABNORMAL LOW (ref 13.0–18.0)
MCH: 31.2 pg (ref 26.0–34.0)
MCHC: 33.2 g/dL (ref 32.0–36.0)
MCV: 93.9 fL (ref 80.0–100.0)
PLATELETS: 448 10*3/uL — AB (ref 150–440)
RBC: 3.74 MIL/uL — ABNORMAL LOW (ref 4.40–5.90)
RDW: 18.3 % — AB (ref 11.5–14.5)
WBC: 5.5 10*3/uL (ref 3.8–10.6)

## 2015-05-10 LAB — PROTIME-INR
INR: 0.91
PROTHROMBIN TIME: 12.5 s (ref 11.4–15.0)

## 2015-05-10 LAB — COMPREHENSIVE METABOLIC PANEL WITH GFR
ALT: 205 U/L — ABNORMAL HIGH (ref 17–63)
AST: 171 U/L — ABNORMAL HIGH (ref 15–41)
Albumin: 3.5 g/dL (ref 3.5–5.0)
Alkaline Phosphatase: 735 U/L — ABNORMAL HIGH (ref 38–126)
Anion gap: 6 (ref 5–15)
BUN: 20 mg/dL (ref 6–20)
CO2: 31 mmol/L (ref 22–32)
Calcium: 8.7 mg/dL — ABNORMAL LOW (ref 8.9–10.3)
Chloride: 101 mmol/L (ref 101–111)
Creatinine, Ser: 0.73 mg/dL (ref 0.61–1.24)
GFR calc Af Amer: >60 mL/min
GFR calc non Af Amer: >60 mL/min
Glucose, Bld: 103 mg/dL — ABNORMAL HIGH (ref 65–99)
Potassium: 3.8 mmol/L (ref 3.5–5.1)
Sodium: 138 mmol/L (ref 135–145)
Total Bilirubin: 5.3 mg/dL — ABNORMAL HIGH (ref 0.3–1.2)
Total Protein: 6.7 g/dL (ref 6.5–8.1)

## 2015-05-10 LAB — APTT: APTT: 27 s (ref 24–36)

## 2015-05-10 MED ORDER — SODIUM CHLORIDE 0.9 % IV SOLN
INTRAVENOUS | Status: DC
  Administered 2015-05-10: 12:00:00 via INTRAVENOUS

## 2015-05-10 MED ORDER — FENTANYL CITRATE (PF) 100 MCG/2ML IJ SOLN
INTRAMUSCULAR | Status: AC | PRN
  Administered 2015-05-10 (×2): 25 ug via INTRAVENOUS
  Administered 2015-05-10: 50 ug via INTRAVENOUS
  Administered 2015-05-10: 25 ug via INTRAVENOUS

## 2015-05-10 MED ORDER — IOHEXOL 300 MG/ML  SOLN
50.0000 mL | Freq: Once | INTRAMUSCULAR | Status: AC | PRN
  Administered 2015-05-10: 20 mL

## 2015-05-10 MED ORDER — VANCOMYCIN HCL IN DEXTROSE 1-5 GM/200ML-% IV SOLN
1000.0000 mg | Freq: Once | INTRAVENOUS | Status: AC
  Administered 2015-05-10: 1000 mg via INTRAVENOUS
  Filled 2015-05-10: qty 200

## 2015-05-10 MED ORDER — ALBUMIN HUMAN 25 % IV SOLN: 25.0000 g | Freq: Once | INTRAVENOUS | Status: DC

## 2015-05-10 MED ORDER — MIDAZOLAM HCL 5 MG/5ML IJ SOLN
INTRAMUSCULAR | Status: AC | PRN
  Administered 2015-05-10: 2 mg via INTRAVENOUS
  Administered 2015-05-10 (×4): 0.5 mg via INTRAVENOUS

## 2015-05-10 MED ORDER — MIDAZOLAM HCL 5 MG/5ML IJ SOLN
INTRAMUSCULAR | Status: AC
  Filled 2015-05-10: qty 10

## 2015-05-10 MED ORDER — FENTANYL CITRATE (PF) 100 MCG/2ML IJ SOLN
INTRAMUSCULAR | Status: AC
  Filled 2015-05-10: qty 4

## 2015-05-10 MED ORDER — LIDOCAINE HCL (PF) 1 % IJ SOLN
INTRAMUSCULAR | Status: DC | PRN
  Administered 2015-05-10: 20 mL

## 2015-05-10 NOTE — H&P (Signed)
Chief Complaint: Patient was seen in consultation today for PTC and placement of biliary drain at the request of Finnegan,Timothy J  Referring Physician(s): Finnegan,Timothy J  History of Present Illness: Curtis Valentine is a 70 y.o. male with GIST lesions and abnormal liver function tests.  Patient has stopped Gleevec due to abnormal liver function and recent imaging demonstrates some intrahepatic biliary dilatation related to the large abdominal mass. Patient is feeling good today except for fatigue.  His swelling has improved recently.  No chest pain and no shortness of breath.    Past Medical History  Diagnosis Date  . Cancer [facility])     GIST 2011 per pt  . Arthritis   . GIST (gastrointestinal stroma tumor), malignant, colon [facility])     Past Surgical History  Procedure Laterality Date  . Gist tumor surgery      Allergies: Morphine and related and Penicillins  Medications: Prior to Admission medications   Medication Sig Start Date End Date Taking? Authorizing Provider  celecoxib (CELEBREX) 200 MG capsule Take 200 mg by mouth daily.   Yes Historical Provider, MD  esomeprazole (NEXIUM) 40 MG capsule Take 40 mg by mouth daily at 12 noon.   Yes Historical Provider, MD  furosemide (LASIX) 20 MG tablet Take 1 tablet (20 mg total) by mouth daily. 04/23/15  Yes Lloyd Huger, MD  Glucosamine-Chondroitin-Ca-D3 (TRIPLE FLEX 50+ PO) Take by mouth.   Yes Historical Provider, MD  imatinib (GLEEVEC) 400 MG tablet Take 1 tablet (400 mg total) by mouth daily. Take with meals and large glass of water.Caution:Chemotherapy. 02/11/15  Yes Lloyd Huger, MD  imatinib (GLEEVEC) 400 MG tablet Take 1 tablet (400 mg total) by mouth daily. Take with meals and large glass of water.Caution:Chemotherapy. 02/13/15  Yes Lloyd Huger, MD  LORazepam (ATIVAN) 0.5 MG tablet Take 0.5 mg by mouth every 8 (eight) hours as needed for anxiety.   Yes Historical Provider, MD  Multiple Vitamins-Minerals  (MULTIVITAMIN MEN PO) Take 1 tablet by mouth.   Yes Historical Provider, MD  Omega-3 Fatty Acids (FISH OIL) 1000 MG CAPS Take by mouth.   Yes Historical Provider, MD  ondansetron (ZOFRAN) 8 MG tablet Take 1 tablet (8 mg total) by mouth every 8 (eight) hours as needed for nausea or vomiting. 03/22/15  Yes Lloyd Huger, MD  vitamin C (ASCORBIC ACID) 500 MG tablet Take 500 mg by mouth daily.   Yes Historical Provider, MD  vitamin E 400 UNIT capsule Take 400 Units by mouth daily.   Yes Historical Provider, MD     History reviewed. No pertinent family history.  Social History   Social History  . Marital Status: Single    Spouse Name: N/A  . Number of Children: N/A  . Years of Education: N/A   Social History Main Topics  . Smoking status: Never Smoker   . Smokeless tobacco: Never Used  . Alcohol Use: Yes     Comment: wine-occasional  . Drug Use: No  . Sexual Activity: Not Asked   Other Topics Concern  . None   Social History Narrative      Review of Systems  Constitutional: Positive for fatigue. Negative for fever and chills.  Respiratory: Negative.  Negative for chest tightness and shortness of breath.   Cardiovascular: Negative.  Negative for chest pain.  Gastrointestinal: Positive for abdominal distention. Negative for abdominal pain.  Genitourinary: Negative.     Vital Signs: BP 111/63 mmHg  Pulse 92  Temp(Src) 97.8  F (36.6 C) (Oral)  Resp 18  Wt 151 lb (68.493 kg)  Physical Exam  Eyes: Scleral icterus is present.  Mild scleral icterus  Cardiovascular: Normal rate, regular rhythm and normal heart sounds.   Pulmonary/Chest: Effort normal and breath sounds normal.  Abdominal: Soft. He exhibits distension. There is no tenderness.  Skin:  Mild jaundice    Mallampati Score:     Imaging: Ct Abdomen Pelvis W Contrast  04/25/2015  CLINICAL DATA:  Malignant GI stromal tumor. Worsening nausea and abdominal bloating for 4 days. Undergoing chemotherapy.  Elevated liver function tests. EXAM: CT ABDOMEN AND PELVIS WITH CONTRAST TECHNIQUE: Multidetector CT imaging of the abdomen and pelvis was performed using the standard protocol following bolus administration of intravenous contrast. CONTRAST:  160m OMNIPAQUE IOHEXOL 300 MG/ML  SOLN COMPARISON:  Abdomen CT on 01/24/2015 and pelvis CT on 12/24/2014 FINDINGS: Lower chest:  No acute findings. Hepatobiliary: Large heterogeneously enhancing soft tissue mass in the right abdomen and porta hepatis shows significant increase in size since previous study. This currently measures 17.8 x 13.0 cm on image 36 compared to 8.7 x 9.5 cm previously. new mild dilatation of intrahepatic bile ducts is seen since previous study. Surgical clips again demonstrate from prior cholecystectomy. No internal liver masses are identified. Pancreas: No mass, inflammatory changes, or other significant abnormality. Spleen: Unremarkable. Adrenals/Urinary Tract: No masses identified. No evidence of hydronephrosis. Stomach/Bowel: No evidence of obstruction, inflammatory process, or abnormal fluid collections. Diverticulosis again seen involving the descending and sigmoid colon, without evidence of diverticulitis. Vascular/Lymphatic: No pathologically enlarged lymph nodes. No evidence of abdominal aortic aneurysm. Reproductive: Stable moderately enlarged prostate. Other: Numerous peritoneal soft tissue nodules and masses are seen within the upper abdomen in the gastrohepatic ligament, porta hepatis, gastrohepatic and gastrosplenic ligaments and omentum, consistent with peritoneal metastases. The show mild increase in size and number since previous study. Index nodule just deep to the umbilicus measures 2.9 x 3.3 cm on image 42 compared to 2.2 x 3.1 cm previously. Mild ascites is also new since previous study. Moderate periumbilical ventral hernia containing only fat is stable. Musculoskeletal:  No suspicious bone lesions identified. IMPRESSION:  Significant increase in size of large right upper quadrant abdominal mass. Mild increase in diffuse peritoneal metastatic disease mainly within the abdomen, and new mild ascites. New mild diffuse intrahepatic biliary ductal dilatation due to large right upper quadrant mass. Electronically Signed   By: JEarle GellM.D.   On: 04/25/2015 15:47    Labs:  CBC:  Recent Labs  03/27/15 0905 04/23/15 1346 04/30/15 1413 05/10/15 1117  WBC 5.5 4.9 3.4* 5.5  HGB 14.6 11.8* 11.8* 11.7*  HCT 42.4 34.6* 34.9* 35.1*  PLT 359 324 334 448*    COAGS:  Recent Labs  01/31/15 1018 05/10/15 1117  INR 0.95 0.91  APTT 29 27    BMP:  Recent Labs  03/27/15 0905 04/23/15 1346 04/30/15 1413 05/10/15 1117  NA 132* 133* 137 138  K 3.6 3.1* 3.6 3.8  CL 97* 99* 99* 101  CO2 27 29 35* 31  GLUCOSE 115* 123* 110* 103*  BUN 21* 17 18 20   CALCIUM 8.4* 8.4* 8.6* 8.7*  CREATININE 0.90 0.91 0.73 0.73  GFRNONAA >60 >60 >60 >60  GFRAA >60 >60 >60 >60    LIVER FUNCTION TESTS:  Recent Labs  03/27/15 0905 04/23/15 1346 04/30/15 1413 05/10/15 1117  BILITOT 0.5 3.8* 4.9* 5.3*  AST 26 323* 218* 171*  ALT 33 376* 286* 205*  ALKPHOS 163*  596* 804* 735*  PROT 6.6 6.0* 6.3* 6.7  ALBUMIN 3.6 3.4* 3.3* 3.5    TUMOR MARKERS: No results for input(s): AFPTM, CEA, CA199, CHROMGRNA in the last 8760 hours.  Assessment and Plan:  70 yo with multiple GIST tumors in abdomen and abnormal liver function tests.  CT imaging demonstrates that the large abdominal mass is causing some compression on biliary system. Patient needs improvement in liver function tests in order to get more treatment for GIST tumors.  Discussed PTC and placement of biliary drain with patient and family in depth.  The risks of infection and bleeding were discussed.  Patient understands that this will be a technically difficult case and we may be unsuccessful.  If we are able to place a drain, will plan to keep patient overnight in hospital  for pain control and to look for signs of infection.  Patient is agreeable to procedure.  Will plan for image guided PTC with attempted drain placement.  Plan for paracentesis if needed as well.    Thank you for this interesting consult.  I greatly enjoyed meeting Ademola Vert and look forward to participating in their care.  A copy of this report was sent to the requesting provider on this date.  Electronically Signed: Carylon Perches 05/10/2015, 1:48 PM

## 2015-05-10 NOTE — Procedures (Signed)
Attempted PTC with [place] and fluoroscopic guidance.  Unable to opacify biliary system. Unable to place biliary drain.

## 2015-05-10 NOTE — Discharge Instructions (Signed)
Biliary Drainage Catheter Placement A biliary drainage catheter is a tube that is inserted through your skin into the bile ducts in your liver. This is sometimes called a percutaneous transhepatic biliary drainage catheter. The purpose of a biliary drainage catheter is to keep bile from backing up into the liver. Bile is a thick yellow or green fluid that helps digest fat in foods. Backup of bile can occur when there is a blockage stopping bile from moving from the bile ducts into your small intestine, as it normally should. This can occur from a tumor or scar tissue. There are three types of biliary drainage. These are:   External biliary drainage. This is where bile is only drained into a collection bag outside your body (external collection bag).   Internal-external biliary drainage. This is where bile is drained to both an outside collection bag as well as into your small intestine.   Internal biliary drainage. This is where bile is only drained into your small intestine.  LET [facility] CARE PROVIDER KNOW ABOUT:  Any allergies you have.  All medicines you are taking, including vitamins, herbs, eye drops, creams, and over-the-counter medicines.  Previous problems you or members of your family have had with the use of anesthetics.  Any blood disorders you have.  Previous surgeries you have had.  Medical conditions you have.  Possibility of pregnancy, if this applies. RISKS AND COMPLICATIONS Generally, this is a safe procedure. However, as with any procedure, problems can occur. Possible problems include:  Bleeding.  Infection.  Injury to the surrounding structures, such as the liver, bile ducts, intestines, or stomach.  Bile leak. BEFORE THE PROCEDURE  Your health care provider may want you to have blood tests. These tests can show how well your kidneys and liver are working. They can also show how well your blood clumps together (clots).   If you take blood thinners  (anticoagulants), ask your health care provider about if and when you should stop taking them.   Make arrangements for someone to drive you home after the procedure.  PROCEDURE  A tube called an IV catheter will be inserted into one of your veins. Medicine will be able to flow right into your body through this catheter. You may be given medicine through this tube to help prevent nausea, pain, or infection.   You will be given a medicine that makes you go to sleep (general anesthetic).   The biliary drainage catheter, which is thin and flexible, will be inserted into a needle.   The needle will be inserted into your body and guided to your liver bile duct, with the help of an imaging method that uses X-ray images (fluoroscopy). Depending on the type of biliary drainage catheter your health care provider inserts, the catheter may be advanced to your small intestine.   A dye will be injected through the catheter. Then, X-rays will be taken. This helps to produce pictures of the bile ducts.   The catheter is then left in place with the help of a stitch placed into your skin and around the tube. If an external or internal-external biliary drainage system is inserted, the catheter will exit your body and be connected to a drainage bag. If an internal biliary drainage system is inserted, the catheter exiting your body will temporarily remain in place until its location is confirmed by X-ray.   The catheter will be removed once the placement of the biliary drainage system is confirmed.  AFTER THE PROCEDURE  You will stay in a recovery area until the general anesthetic has worn off. Your blood pressure and pulse will be checked.   You may have discomfort at the drain site. You will be given pain medicines to control the pain.  You will be instructed on how to empty and care for the drain.  You will need to remain lying down for several hours.   This information is not intended to  replace advice given to you by your health care provider. Make sure you discuss any questions you have with your health care provider.   Document Released: 01/11/2013 Document Revised: 03/28/2013 Document Reviewed: 01/11/2013 Elsevier Interactive Patient Education [employer].

## 2015-05-13 ENCOUNTER — Inpatient Hospital Stay: Payer: Medicare Other | Attending: Oncology

## 2015-05-13 ENCOUNTER — Inpatient Hospital Stay (HOSPITAL_BASED_OUTPATIENT_CLINIC_OR_DEPARTMENT_OTHER): Payer: Medicare Other | Admitting: Oncology

## 2015-05-13 ENCOUNTER — Other Ambulatory Visit: Payer: Self-pay | Admitting: *Deleted

## 2015-05-13 VITALS — BP 110/74 | HR 83 | Temp 97.3°F | Resp 16 | Wt 154.1 lb

## 2015-05-13 DIAGNOSIS — R109 Unspecified abdominal pain: Secondary | ICD-10-CM | POA: Diagnosis not present

## 2015-05-13 DIAGNOSIS — R188 Other ascites: Secondary | ICD-10-CM | POA: Diagnosis not present

## 2015-05-13 DIAGNOSIS — R531 Weakness: Secondary | ICD-10-CM | POA: Insufficient documentation

## 2015-05-13 DIAGNOSIS — C49A4 Gastrointestinal stromal tumor of large intestine: Secondary | ICD-10-CM | POA: Diagnosis not present

## 2015-05-13 DIAGNOSIS — R634 Abnormal weight loss: Secondary | ICD-10-CM | POA: Insufficient documentation

## 2015-05-13 DIAGNOSIS — K439 Ventral hernia without obstruction or gangrene: Secondary | ICD-10-CM | POA: Insufficient documentation

## 2015-05-13 DIAGNOSIS — R5383 Other fatigue: Secondary | ICD-10-CM | POA: Insufficient documentation

## 2015-05-13 DIAGNOSIS — C49A2 Gastrointestinal stromal tumor of stomach: Secondary | ICD-10-CM

## 2015-05-13 DIAGNOSIS — R609 Edema, unspecified: Secondary | ICD-10-CM

## 2015-05-13 DIAGNOSIS — N400 Enlarged prostate without lower urinary tract symptoms: Secondary | ICD-10-CM | POA: Diagnosis not present

## 2015-05-13 DIAGNOSIS — R7989 Other specified abnormal findings of blood chemistry: Secondary | ICD-10-CM | POA: Insufficient documentation

## 2015-05-13 DIAGNOSIS — F419 Anxiety disorder, unspecified: Secondary | ICD-10-CM | POA: Insufficient documentation

## 2015-05-13 DIAGNOSIS — C49A0 Gastrointestinal stromal tumor, unspecified site: Secondary | ICD-10-CM

## 2015-05-13 DIAGNOSIS — R110 Nausea: Secondary | ICD-10-CM | POA: Diagnosis not present

## 2015-05-13 DIAGNOSIS — R748 Abnormal levels of other serum enzymes: Secondary | ICD-10-CM

## 2015-05-13 DIAGNOSIS — Z79899 Other long term (current) drug therapy: Secondary | ICD-10-CM | POA: Diagnosis not present

## 2015-05-13 DIAGNOSIS — M129 Arthropathy, unspecified: Secondary | ICD-10-CM | POA: Insufficient documentation

## 2015-05-13 LAB — CBC WITH DIFFERENTIAL/PLATELET
BASOS PCT: 2 %
Basophils Absolute: 0.1 10*3/uL (ref 0–0.1)
Eosinophils Absolute: 0.2 10*3/uL (ref 0–0.7)
Eosinophils Relative: 3 %
HEMATOCRIT: 35.8 % — AB (ref 40.0–52.0)
Hemoglobin: 12.3 g/dL — ABNORMAL LOW (ref 13.0–18.0)
LYMPHS ABS: 1 10*3/uL (ref 1.0–3.6)
Lymphocytes Relative: 17 %
MCH: 31.5 pg (ref 26.0–34.0)
MCHC: 34.3 g/dL (ref 32.0–36.0)
MCV: 91.9 fL (ref 80.0–100.0)
MONO ABS: 0.6 10*3/uL (ref 0.2–1.0)
MONOS PCT: 10 %
NEUTROS ABS: 4 10*3/uL (ref 1.4–6.5)
Neutrophils Relative %: 68 %
Platelets: 430 10*3/uL (ref 150–440)
RBC: 3.9 MIL/uL — ABNORMAL LOW (ref 4.40–5.90)
RDW: 18 % — AB (ref 11.5–14.5)
WBC: 5.9 10*3/uL (ref 3.8–10.6)

## 2015-05-13 LAB — COMPREHENSIVE METABOLIC PANEL
ALK PHOS: 784 U/L — AB (ref 38–126)
ALT: 149 U/L — AB (ref 17–63)
ANION GAP: 7 (ref 5–15)
AST: 123 U/L — AB (ref 15–41)
Albumin: 3.2 g/dL — ABNORMAL LOW (ref 3.5–5.0)
BILIRUBIN TOTAL: 5.7 mg/dL — AB (ref 0.3–1.2)
BUN: 18 mg/dL (ref 6–20)
CALCIUM: 8.5 mg/dL — AB (ref 8.9–10.3)
CHLORIDE: 97 mmol/L — AB (ref 101–111)
CO2: 28 mmol/L (ref 22–32)
CREATININE: 0.7 mg/dL (ref 0.61–1.24)
GLUCOSE: 113 mg/dL — AB (ref 65–99)
Potassium: 4 mmol/L (ref 3.5–5.1)
Sodium: 132 mmol/L — ABNORMAL LOW (ref 135–145)
Total Protein: 6.5 g/dL (ref 6.5–8.1)

## 2015-05-13 LAB — MAGNESIUM: MAGNESIUM: 2.2 mg/dL (ref 1.7–2.4)

## 2015-05-13 MED ORDER — NILOTINIB HCL 200 MG PO CAPS: 200.0000 mg | ORAL_CAPSULE | Freq: Two times a day (BID) | ORAL | Status: DC

## 2015-05-13 NOTE — Progress Notes (Signed)
Patient has occasional nausea that is relieved with Zofran.

## 2015-05-15 NOTE — Progress Notes (Signed)
[facility]  Telephone:(336) [phone] Fax:(336) [phone]  ID: Curtis Valentine OB: 04-12-1945  MR#: [id number]  [id number]  Patient Care Team: Rusty Aus, MD as PCP - General (Internal Medicine)  CHIEF COMPLAINT:  Chief Complaint  Patient presents with  . GIST    INTERVAL HISTORY: Patient returns to clinic today for further evaluation and treatment planning. He had an unsuccessful percutaneous stent attempt with interventional radiology last week. discussion of his imaging results. He currently feels well and nearly back to his baseline. He continues to have abdominal bloating. He has occasional nausea, but this is well controlled with Zofran. His edema has improved. He continues to be highly anxious, but otherwise feels well.  He has no neurologic complaints.  He denies any recent fevers or illnesses. He has a fair  appetite and denies weight loss.  He denies any pain.  He denies any vomiting, constipation, or diarrhea.  He denies any melena or hematochezia.  He has no urinary complaints.  Patient offers no further specific complaints today.   REVIEW OF SYSTEMS:   Review of Systems  Constitutional: Negative.  Negative for weight loss and malaise/fatigue.  Respiratory: Negative.  Negative for shortness of breath.   Cardiovascular: Positive for leg swelling. Negative for chest pain.  Gastrointestinal: Positive for nausea and abdominal pain. Negative for vomiting, diarrhea, constipation, blood in stool and melena.  Neurological: Negative.  Negative for weakness.  Psychiatric/Behavioral: The patient is nervous/anxious.     As per HPI. Otherwise, a complete review of systems is negatve.  PAST MEDICAL HISTORY: Past Medical History  Diagnosis Date  . Cancer [facility])     GIST 2011 per pt  . Arthritis   . GIST (gastrointestinal stroma tumor), malignant, colon ([place])     PAST SURGICAL HISTORY: Past Surgical History  Procedure Laterality Date  . Gist tumor surgery        FAMILY HISTORY: Reviewed and unchanged. No reported history of malignancy or chronic disease.     ADVANCED DIRECTIVES:    HEALTH MAINTENANCE: Social History  Substance Use Topics  . Smoking status: Never Smoker   . Smokeless tobacco: Never Used  . Alcohol Use: Yes     Comment: wine-occasional     Colonoscopy:  PAP:  Bone density:  Lipid panel:  Allergies  Allergen Reactions  . Morphine And Related Other (See Comments)    Very confused.   Marland Kitchen Penicillins Hives    Itch and breakout in hives.    Current Outpatient Prescriptions  Medication Sig Dispense Refill  . celecoxib (CELEBREX) 200 MG capsule Take 200 mg by mouth daily.    Marland Kitchen esomeprazole (NEXIUM) 40 MG capsule Take 40 mg by mouth daily at 12 noon.    . furosemide (LASIX) 20 MG tablet Take 1 tablet (20 mg total) by mouth daily. 30 tablet 2  . Glucosamine-Chondroitin-Ca-D3 (TRIPLE FLEX 50+ PO) Take by mouth.    Marland Kitchen LORazepam (ATIVAN) 0.5 MG tablet Take 0.5 mg by mouth every 8 (eight) hours as needed for anxiety.    . Multiple Vitamins-Minerals (MULTIVITAMIN MEN PO) Take 1 tablet by mouth.    . Omega-3 Fatty Acids (FISH OIL) 1000 MG CAPS Take by mouth.    . ondansetron (ZOFRAN) 8 MG tablet Take 1 tablet (8 mg total) by mouth every 8 (eight) hours as needed for nausea or vomiting. 60 tablet 2  . vitamin C (ASCORBIC ACID) 500 MG tablet Take 500 mg by mouth daily.    . vitamin E 400  UNIT capsule Take 400 Units by mouth daily.    Marland Kitchen imatinib (GLEEVEC) 400 MG tablet Take 1 tablet (400 mg total) by mouth daily. Take with meals and large glass of water.Caution:Chemotherapy. (Patient not taking: Reported on 05/13/2015) 30 tablet 5  . imatinib (GLEEVEC) 400 MG tablet Take 1 tablet (400 mg total) by mouth daily. Take with meals and large glass of water.Caution:Chemotherapy. (Patient not taking: Reported on 05/13/2015) 30 tablet 5  . nilotinib (TASIGNA) 200 MG capsule Take 1 capsule (200 mg total) by mouth every 12 (twelve) hours. Give on  an empty stomach 1 hour before or 2 hours after meals. 60 capsule 0   No current facility-administered medications for this visit.    OBJECTIVE: Filed Vitals:   05/13/15 0951  BP: 110/74  Pulse: 83  Temp: 97.3 F (36.3 C)  Resp: 16     Body mass index is 23.44 kg/(m^2).    ECOG FS:1 - Symptomatic but completely ambulatory  General: Well-developed, well-nourished, no acute distress. Eyes: Pink conjunctiva, mildly icteric sclera. Lungs: Clear to auscultation bilaterally. Heart: Regular rate and rhythm. No rubs, murmurs, or gallops. Abdomen: Mildly distended. No palpable masses. Musculoskeletal: 2-3+ bilateral lower extremity edema. Neuro: Alert, answering all questions appropriately. Cranial nerves grossly intact. Skin: Mild jaundice. Psych: Normal affect.   LAB RESULTS:  Lab Results  Component Value Date   NA 132* 05/13/2015   K 4.0 05/13/2015   CL 97* 05/13/2015   CO2 28 05/13/2015   GLUCOSE 113* 05/13/2015   BUN 18 05/13/2015   CREATININE 0.70 05/13/2015   CALCIUM 8.5* 05/13/2015   PROT 6.5 05/13/2015   ALBUMIN 3.2* 05/13/2015   AST 123* 05/13/2015   ALT 149* 05/13/2015   ALKPHOS 784* 05/13/2015   BILITOT 5.7* 05/13/2015   GFRNONAA >60 05/13/2015   GFRAA >60 05/13/2015    Lab Results  Component Value Date   WBC 5.9 05/13/2015   NEUTROABS 4.0 05/13/2015   HGB 12.3* 05/13/2015   HCT 35.8* 05/13/2015   MCV 91.9 05/13/2015   PLT 430 05/13/2015     STUDIES: [place] Intraoperative  05/10/2015  CLINICAL DATA:  Ultrasound was provided for use by the ordering physician, and a technical charge was applied by the performing facility.  No radiologist interpretation/professional services rendered.   Ct Abdomen Pelvis W Contrast  04/25/2015  CLINICAL DATA:  Malignant GI stromal tumor. Worsening nausea and abdominal bloating for 4 days. Undergoing chemotherapy. Elevated liver function tests. EXAM: CT ABDOMEN AND PELVIS WITH CONTRAST TECHNIQUE: Multidetector CT imaging of  the abdomen and pelvis was performed using the standard protocol following bolus administration of intravenous contrast. CONTRAST:  164mL OMNIPAQUE IOHEXOL 300 MG/ML  SOLN COMPARISON:  Abdomen CT on 01/24/2015 and pelvis CT on 12/24/2014 FINDINGS: Lower chest:  No acute findings. Hepatobiliary: Large heterogeneously enhancing soft tissue mass in the right abdomen and porta hepatis shows significant increase in size since previous study. This currently measures 17.8 x 13.0 cm on image 36 compared to 8.7 x 9.5 cm previously. new mild dilatation of intrahepatic bile ducts is seen since previous study. Surgical clips again demonstrate from prior cholecystectomy. No internal liver masses are identified. Pancreas: No mass, inflammatory changes, or other significant abnormality. Spleen: Unremarkable. Adrenals/Urinary Tract: No masses identified. No evidence of hydronephrosis. Stomach/Bowel: No evidence of obstruction, inflammatory process, or abnormal fluid collections. Diverticulosis again seen involving the descending and sigmoid colon, without evidence of diverticulitis. Vascular/Lymphatic: No pathologically enlarged lymph nodes. No evidence of abdominal aortic aneurysm. Reproductive: Stable moderately  enlarged prostate. Other: Numerous peritoneal soft tissue nodules and masses are seen within the upper abdomen in the gastrohepatic ligament, porta hepatis, gastrohepatic and gastrosplenic ligaments and omentum, consistent with peritoneal metastases. The show mild increase in size and number since previous study. Index nodule just deep to the umbilicus measures 2.9 x 3.3 cm on image 42 compared to 2.2 x 3.1 cm previously. Mild ascites is also new since previous study. Moderate periumbilical ventral hernia containing only fat is stable. Musculoskeletal:  No suspicious bone lesions identified. IMPRESSION: Significant increase in size of large right upper quadrant abdominal mass. Mild increase in diffuse peritoneal  metastatic disease mainly within the abdomen, and new mild ascites. New mild diffuse intrahepatic biliary ductal dilatation due to large right upper quadrant mass. Electronically Signed   By: Earle Gell M.D.   On: 04/25/2015 15:47   Ir Guided Niel Hummer W Catheter Placement  05/10/2015  INDICATION: 69 year old with GIST and abnormal liver function tests. Patient has an elevated bilirubin level and trying to decrease bilirubin level so the patient can get additional oncology treatment. EXAM: ATTEMPTED PERCUTANEOUS TRANSHEPATIC CHOLANGIOGRAM WITH ULTRASOUND AND FLUOROSCOPIC GUIDANCE MEDICATIONS: Vancomycin 1 g. Vancomycin was given within two hours of incision. Vancomycin was given due to a penicillin allergy. ANESTHESIA/SEDATION: Fentanyl 125 mcg IV; Versed 4 mg IV Moderate Sedation Time:  48 The patient was continuously monitored during the procedure by the interventional radiology nurse under my direct supervision. FLUOROSCOPY TIME:  5.6 minutes, 89 mGy COMPLICATIONS: None immediate. PROCEDURE: Informed written consent was obtained from the patient after a thorough discussion of the procedural risks, benefits and alternatives. All questions were addressed. Maximal Sterile Barrier Technique was utilized including caps, mask, sterile gowns, sterile gloves, sterile drape, hand hygiene and skin antiseptic. A timeout was performed prior to the initiation of the procedure. The abdomen was evaluated with ultrasound. A large mass in the upper abdomen adjacent to the liver was identified. Skin was anesthetized with 1% lidocaine. Using ultrasound and fluoroscopic guidance, [place] needles were directed into the liver from both right and left hepatic approaches. There was no significant peripheral intrahepatic biliary dilatation. Attempts were made to puncture one of the central biliary ducts but this was unsuccessful. Contrast was unable to opacify the biliary system on multiple occasions. Biliary system could never be  successfully opacified with contrast. Therefore, a biliary drain was not placed. FINDINGS: Large mass along the inferior aspect of the liver. There is no significant intrahepatic biliary dilatation. Needle was directed into the liver with ultrasound and fluoroscopic guidance. The biliary system could never be opacified with contrast. IMPRESSION: Attempted percutaneous transhepatic cholangiogram with ultrasound and fluoroscopic guidance. The biliary system could not be successfully opacified with contrast due to lack of biliary dilatation. Therefore, biliary drain could not be placed. Findings were discussed with Dr. Grayland Ormond. Plan to follow patient's liver function tests and repeat imaging if the bilirubin continues to increase. Electronically Signed   By: Markus Daft M.D.   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  PLAN:    1. GIST: See clinic note dated February 16, 2013 for detailed history of his disease process.  CT scan results from April 25, 2015 reviewed independently with significant increase in his known heterogeneous mass near the head of the pancreas likely invading the liver. Patient is not a surgical candidate. Interventional radiology was unable to complete percutaneous bile duct cannulation. Patient cannot take Gleevec at this time given  his worsening bilirubin. He can take dose reduced Tasigna at 200 mg twice a day. Will closely monitor him with weekly laboratory work. Return to clinic in 2 weeks for further evaluation.  2. Anxiety: Continue Ativan 0.5 mg 3 times a day as needed. 3. Nausea: Continue ondansetron as needed. 4. Elevated liver enzymes: Slightly improved since discontinuing the fact. Although progression of disease and also be affecting the elevation of liver enzymes. Discontinue Gleevec as above. 5. Elevated bilirubin: Likely secondary to progression of disease.   Approximately 30 minutes was spent in discussion  of which greater than 50% was consultation.  Patient expressed understanding and was in agreement with this plan. He also understands that He can call clinic at any time with any questions, concerns, or complaints.    Lloyd Huger, MD   05/15/2015 1:56 PM

## 2015-05-20 ENCOUNTER — Telehealth: Payer: Self-pay

## 2015-05-20 ENCOUNTER — Other Ambulatory Visit: Payer: Self-pay | Admitting: Oncology

## 2015-05-20 DIAGNOSIS — C49A0 Gastrointestinal stromal tumor, unspecified site: Secondary | ICD-10-CM

## 2015-05-20 MED ORDER — OXYCODONE HCL 10 MG PO TABS: 10.0000 mg | ORAL_TABLET | Freq: Four times a day (QID) | ORAL | Status: DC | PRN

## 2015-05-20 NOTE — Telephone Encounter (Signed)
Curtis Valentine called back, they feel that the ER could not offer them much more than a pain medication.  They want to know if you will write him a pain medication (can't not take Morphine).

## 2015-05-20 NOTE — Telephone Encounter (Signed)
Per Dr. Grayland Ormond advise patient to go to ER. I left message for Mikki Santee to inform him of this.

## 2015-05-20 NOTE — Telephone Encounter (Signed)
Curtis Valentine is having acute right side pain, 10/10 on pain scale, with occasional left side.  Prefers to come see you, please advise.

## 2015-05-22 ENCOUNTER — Telehealth: Payer: Self-pay

## 2015-05-22 NOTE — Telephone Encounter (Signed)
Patient informed. 

## 2015-05-22 NOTE — Telephone Encounter (Signed)
All antiacids can potentially reduce the effectiveness of Tasigna.  Can consider a short acting medication like OTC tums a minimum of 2hr separated from taking Tasigna to minimize the interaction.

## 2015-05-22 NOTE — Telephone Encounter (Signed)
Patient has been taking Nexium for years but he was advised not to take Nexium with Tasigna.  Is there something else he can take on a prn basis while taking the Tasigna.

## 2015-05-24 ENCOUNTER — Other Ambulatory Visit: Payer: Self-pay | Admitting: *Deleted

## 2015-05-24 ENCOUNTER — Other Ambulatory Visit: Payer: Self-pay | Admitting: Oncology

## 2015-05-24 DIAGNOSIS — C49A0 Gastrointestinal stromal tumor, unspecified site: Secondary | ICD-10-CM

## 2015-05-27 ENCOUNTER — Inpatient Hospital Stay (HOSPITAL_BASED_OUTPATIENT_CLINIC_OR_DEPARTMENT_OTHER): Payer: Medicare Other | Admitting: Oncology

## 2015-05-27 ENCOUNTER — Inpatient Hospital Stay: Payer: Medicare Other

## 2015-05-27 VITALS — BP 103/69 | HR 90 | Temp 97.9°F | Resp 16 | Wt 146.4 lb

## 2015-05-27 DIAGNOSIS — Z79899 Other long term (current) drug therapy: Secondary | ICD-10-CM

## 2015-05-27 DIAGNOSIS — R188 Other ascites: Secondary | ICD-10-CM

## 2015-05-27 DIAGNOSIS — C49A4 Gastrointestinal stromal tumor of large intestine: Secondary | ICD-10-CM

## 2015-05-27 DIAGNOSIS — C49A0 Gastrointestinal stromal tumor, unspecified site: Secondary | ICD-10-CM

## 2015-05-27 DIAGNOSIS — N400 Enlarged prostate without lower urinary tract symptoms: Secondary | ICD-10-CM

## 2015-05-27 DIAGNOSIS — R5383 Other fatigue: Secondary | ICD-10-CM

## 2015-05-27 DIAGNOSIS — K439 Ventral hernia without obstruction or gangrene: Secondary | ICD-10-CM

## 2015-05-27 DIAGNOSIS — R110 Nausea: Secondary | ICD-10-CM | POA: Diagnosis not present

## 2015-05-27 DIAGNOSIS — R748 Abnormal levels of other serum enzymes: Secondary | ICD-10-CM

## 2015-05-27 DIAGNOSIS — R630 Anorexia: Secondary | ICD-10-CM

## 2015-05-27 DIAGNOSIS — R109 Unspecified abdominal pain: Secondary | ICD-10-CM

## 2015-05-27 DIAGNOSIS — R531 Weakness: Secondary | ICD-10-CM

## 2015-05-27 DIAGNOSIS — R634 Abnormal weight loss: Secondary | ICD-10-CM

## 2015-05-27 DIAGNOSIS — M129 Arthropathy, unspecified: Secondary | ICD-10-CM

## 2015-05-27 DIAGNOSIS — F419 Anxiety disorder, unspecified: Secondary | ICD-10-CM

## 2015-05-27 DIAGNOSIS — R7989 Other specified abnormal findings of blood chemistry: Secondary | ICD-10-CM

## 2015-05-27 DIAGNOSIS — R609 Edema, unspecified: Secondary | ICD-10-CM

## 2015-05-27 LAB — URINALYSIS COMPLETE WITH MICROSCOPIC (ARMC ONLY)
Bacteria, UA: NONE SEEN
Glucose, UA: NEGATIVE mg/dL
Hgb urine dipstick: NEGATIVE
KETONES UR: NEGATIVE mg/dL
Leukocytes, UA: NEGATIVE
NITRITE: NEGATIVE
PROTEIN: NEGATIVE mg/dL
RBC / HPF: NONE SEEN RBC/hpf (ref 0–5)
SPECIFIC GRAVITY, URINE: 1.018 (ref 1.005–1.030)
pH: 5 (ref 5.0–8.0)

## 2015-05-27 LAB — COMPREHENSIVE METABOLIC PANEL
ALK PHOS: 891 U/L — AB (ref 38–126)
ALT: 84 U/L — AB (ref 17–63)
AST: 93 U/L — AB (ref 15–41)
Albumin: 3.1 g/dL — ABNORMAL LOW (ref 3.5–5.0)
Anion gap: 6 (ref 5–15)
BILIRUBIN TOTAL: 5.4 mg/dL — AB (ref 0.3–1.2)
BUN: 14 mg/dL (ref 6–20)
CALCIUM: 8.3 mg/dL — AB (ref 8.9–10.3)
CHLORIDE: 100 mmol/L — AB (ref 101–111)
CO2: 28 mmol/L (ref 22–32)
CREATININE: 0.9 mg/dL (ref 0.61–1.24)
Glucose, Bld: 124 mg/dL — ABNORMAL HIGH (ref 65–99)
Potassium: 3.3 mmol/L — ABNORMAL LOW (ref 3.5–5.1)
Sodium: 134 mmol/L — ABNORMAL LOW (ref 135–145)
Total Protein: 6.7 g/dL (ref 6.5–8.1)

## 2015-05-27 LAB — CBC WITH DIFFERENTIAL/PLATELET
BASOS ABS: 0.1 10*3/uL (ref 0–0.1)
Basophils Relative: 1 %
EOS PCT: 1 %
Eosinophils Absolute: 0 10*3/uL (ref 0–0.7)
HCT: 34.3 % — ABNORMAL LOW (ref 40.0–52.0)
Hemoglobin: 11.8 g/dL — ABNORMAL LOW (ref 13.0–18.0)
LYMPHS PCT: 10 %
Lymphs Abs: 0.7 10*3/uL — ABNORMAL LOW (ref 1.0–3.6)
MCH: 31.9 pg (ref 26.0–34.0)
MCHC: 34.4 g/dL (ref 32.0–36.0)
MCV: 92.9 fL (ref 80.0–100.0)
MONO ABS: 0.7 10*3/uL (ref 0.2–1.0)
MONOS PCT: 10 %
Neutro Abs: 5.6 10*3/uL (ref 1.4–6.5)
Neutrophils Relative %: 78 %
PLATELETS: 469 10*3/uL — AB (ref 150–440)
RBC: 3.69 MIL/uL — ABNORMAL LOW (ref 4.40–5.90)
RDW: 17.4 % — AB (ref 11.5–14.5)
WBC: 7.1 10*3/uL (ref 3.8–10.6)

## 2015-05-27 LAB — PHOSPHORUS: Phosphorus: 2.7 mg/dL (ref 2.5–4.6)

## 2015-05-27 LAB — MAGNESIUM: MAGNESIUM: 2.1 mg/dL (ref 1.7–2.4)

## 2015-05-27 MED ORDER — TRAMADOL HCL 50 MG PO TABS: 50.0000 mg | ORAL_TABLET | Freq: Four times a day (QID) | ORAL | Status: DC | PRN

## 2015-05-27 MED ORDER — MEGESTROL ACETATE 40 MG PO TABS: 40.0000 mg | ORAL_TABLET | Freq: Every day | ORAL | Status: DC

## 2015-05-27 NOTE — Progress Notes (Signed)
Patient woke up last Monday with severe left side pain but has not had the pain since.  Has a loss of appetite, nausea that is relieved with medication, and hot flashes during the night.  Started the [place] last Tuesday and his stomach has not "felt right" since.

## 2015-05-27 NOTE — Progress Notes (Signed)
[facility]  Telephone:(336) [phone] Fax:(336) [phone]  ID: Curtis Valentine OB: Jan 20, 1946  MR#: [id number]  [id number]  Patient Care Team: Rusty Aus, MD as PCP - General (Internal Medicine)  CHIEF COMPLAINT:  Chief Complaint  Patient presents with  . GIST    INTERVAL HISTORY: Patient returns to clinic today for further evaluation after initiating Tarceva. He started taking Tarceva on May 21, 2015. He states on Monday February 13th, he had unbearable pain in his right side that lasted throughout the day. He took tylenol throughout the day but didn't want to take oxycodone because it upsets his stomach.  He has not had that pain since that day. He has occasional nausea, but this is well controlled with Zofran. He feels very weak and fatigued and sleeps much of the day.  He has no appetite and finds it hard to eat. He states that since starting Tarceva he just doesn't feel like eating. He is having trouble coordinating eating with taking his Tarceva on an empty stomach. He has no neurologic complaints.  He denies any recent fevers or illnesses.  He denies any vomiting, constipation, or diarrhea.  He denies any melena or hematochezia.  He has no urinary complaints. He is going to fly to [place] this weekend for a few days to be with family.    REVIEW OF SYSTEMS:   Review of Systems  Constitutional: Positive for weight loss and malaise/fatigue.  Respiratory: Negative.  Negative for shortness of breath.   Cardiovascular: Negative for chest pain and leg swelling.  Gastrointestinal: Negative for nausea, vomiting, abdominal pain, diarrhea, constipation, blood in stool and melena.  Neurological: Positive for weakness.  Psychiatric/Behavioral: The patient is nervous/anxious.     As per HPI. Otherwise, a complete review of systems is negatve.  PAST MEDICAL HISTORY: Past Medical History  Diagnosis Date  . Cancer [facility])     GIST 2011 per pt  . Arthritis   . GIST  (gastrointestinal stroma tumor), malignant, colon ([place])     PAST SURGICAL HISTORY: Past Surgical History  Procedure Laterality Date  . Gist tumor surgery      FAMILY HISTORY: Reviewed and unchanged. No reported history of malignancy or chronic disease.     ADVANCED DIRECTIVES:    HEALTH MAINTENANCE: Social History  Substance Use Topics  . Smoking status: Never Smoker   . Smokeless tobacco: Never Used  . Alcohol Use: Yes     Comment: wine-occasional     Colonoscopy:  PAP:  Bone density:  Lipid panel:  Allergies  Allergen Reactions  . Morphine And Related Other (See Comments)    Very confused.   Marland Kitchen Penicillins Hives    Itch and breakout in hives.    Current Outpatient Prescriptions  Medication Sig Dispense Refill  . celecoxib (CELEBREX) 200 MG capsule Take 200 mg by mouth daily.    Marland Kitchen esomeprazole (NEXIUM) 40 MG capsule Take 40 mg by mouth daily at 12 noon.    . furosemide (LASIX) 20 MG tablet Take 1 tablet (20 mg total) by mouth daily. 30 tablet 2  . Glucosamine-Chondroitin-Ca-D3 (TRIPLE FLEX 50+ PO) Take by mouth.    Marland Kitchen LORazepam (ATIVAN) 0.5 MG tablet Take 0.5 mg by mouth every 8 (eight) hours as needed for anxiety.    . Multiple Vitamins-Minerals (MULTIVITAMIN MEN PO) Take 1 tablet by mouth.    . nilotinib (TASIGNA) 200 MG capsule Take 1 capsule (200 mg total) by mouth every 12 (twelve) hours. Give on an empty stomach  1 hour before or 2 hours after meals. 60 capsule 0  . Omega-3 Fatty Acids (FISH OIL) 1000 MG CAPS Take by mouth.    . ondansetron (ZOFRAN) 8 MG tablet Take 1 tablet (8 mg total) by mouth every 8 (eight) hours as needed for nausea or vomiting. 60 tablet 2  . Oxycodone HCl 10 MG TABS Take 1 tablet (10 mg total) by mouth every 6 (six) hours as needed. 90 tablet 0  . vitamin C (ASCORBIC ACID) 500 MG tablet Take 500 mg by mouth daily.    . vitamin E 400 UNIT capsule Take 400 Units by mouth daily.    . megestrol (MEGACE) 40 MG tablet Take 1 tablet (40 mg  total) by mouth daily. 30 tablet 0   No current facility-administered medications for this visit.    OBJECTIVE: Filed Vitals:   05/27/15 1046  BP: 103/69  Pulse: 90  Temp: 97.9 F (36.6 C)  Resp: 16     Body mass index is 22.26 kg/(m^2).    ECOG FS:1 - Symptomatic but completely ambulatory  General: Appears weak,  no acute distress. Eyes: Pink conjunctiva, mildly icteric sclera. Lungs: Clear to auscultation bilaterally. Heart: Regular rate and rhythm. No rubs, murmurs, or gallops. Abdomen: Mildly distended. No palpable masses. Musculoskeletal: No swelling. Neuro: Alert, answering all questions appropriately. Cranial nerves grossly intact. Skin: Mild jaundice. Psych: Normal affect.   LAB RESULTS:  Lab Results  Component Value Date   NA 134* 05/27/2015   K 3.3* 05/27/2015   CL 100* 05/27/2015   CO2 28 05/27/2015   GLUCOSE 124* 05/27/2015   BUN 14 05/27/2015   CREATININE 0.90 05/27/2015   CALCIUM 8.3* 05/27/2015   PROT 6.7 05/27/2015   ALBUMIN 3.1* 05/27/2015   AST 93* 05/27/2015   ALT 84* 05/27/2015   ALKPHOS 891* 05/27/2015   BILITOT 5.4* 05/27/2015   GFRNONAA >60 05/27/2015   GFRAA >60 05/27/2015    Lab Results  Component Value Date   WBC 7.1 05/27/2015   NEUTROABS 5.6 05/27/2015   HGB 11.8* 05/27/2015   HCT 34.3* 05/27/2015   MCV 92.9 05/27/2015   PLT 469* 05/27/2015     STUDIES: [place] Intraoperative  05/10/2015  CLINICAL DATA:  Ultrasound was provided for use by the ordering physician, and a technical charge was applied by the performing facility.  No radiologist interpretation/professional services rendered.   Ir Lenise Arena W Catheter Placement  05/10/2015  INDICATION: 70 year old with GIST and abnormal liver function tests. Patient has an elevated bilirubin level and trying to decrease bilirubin level so the patient can get additional oncology treatment. EXAM: ATTEMPTED PERCUTANEOUS TRANSHEPATIC CHOLANGIOGRAM WITH ULTRASOUND AND FLUOROSCOPIC GUIDANCE  MEDICATIONS: Vancomycin 1 g. Vancomycin was given within two hours of incision. Vancomycin was given due to a penicillin allergy. ANESTHESIA/SEDATION: Fentanyl 125 mcg IV; Versed 4 mg IV Moderate Sedation Time:  48 The patient was continuously monitored during the procedure by the interventional radiology nurse under my direct supervision. FLUOROSCOPY TIME:  5.6 minutes, 89 mGy COMPLICATIONS: None immediate. PROCEDURE: Informed written consent was obtained from the patient after a thorough discussion of the procedural risks, benefits and alternatives. All questions were addressed. Maximal Sterile Barrier Technique was utilized including caps, mask, sterile gowns, sterile gloves, sterile drape, hand hygiene and skin antiseptic. A timeout was performed prior to the initiation of the procedure. The abdomen was evaluated with ultrasound. A large mass in the upper abdomen adjacent to the liver was identified. Skin was anesthetized with 1% lidocaine. Using ultrasound  and fluoroscopic guidance, [place] needles were directed into the liver from both right and left hepatic approaches. There was no significant peripheral intrahepatic biliary dilatation. Attempts were made to puncture one of the central biliary ducts but this was unsuccessful. Contrast was unable to opacify the biliary system on multiple occasions. Biliary system could never be successfully opacified with contrast. Therefore, a biliary drain was not placed. FINDINGS: Large mass along the inferior aspect of the liver. There is no significant intrahepatic biliary dilatation. Needle was directed into the liver with ultrasound and fluoroscopic guidance. The biliary system could never be opacified with contrast. IMPRESSION: Attempted percutaneous transhepatic cholangiogram with ultrasound and fluoroscopic guidance. The biliary system could not be successfully opacified with contrast due to lack of biliary dilatation. Therefore, biliary drain could not be  placed. Findings were discussed with Dr. Grayland Ormond. Plan to follow patient's liver function tests and repeat imaging if the bilirubin continues to increase. Electronically Signed   By: Markus Daft M.D.   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  PLAN:    1. GIST: See clinic note dated February 16, 2013 for detailed history of his disease process.  CT scan results from April 25, 2015 reviewed independently with significant increase in his known heterogeneous mass near the head of the pancreas likely invading the liver. Patient is not a surgical candidate. Interventional radiology was unable to complete percutaneous bile duct cannulation. Patient cannot take Gleevec at this time given his worsening bilirubin. Continue Tasigna at 200 mg twice a day. Return to clinic in 2 weeks for further evaluation.  2. Anxiety: Continue Ativan 0.5 mg 3 times a day as needed. 3. Nausea: Resolved. Continue ondansetron as needed. 4. Elevated liver enzymes: Slightly improved. Monitor. 5. Elevated bilirubin: Likely secondary to progression of disease. Slightly improved. Monitor. 6. Decreased appetite: Megace prescription sent to pharmacy today. 7. Pain: Tramadol as needed.   Approximately 30 minutes was spent in discussion of which greater than 50% was consultation.  Patient expressed understanding and was in agreement with this plan. He also understands that He can call clinic at any time with any questions, concerns, or complaints.    Mayra Reel, NP   05/27/2015 1:25 PM  Patient seen and evaluated independently and I agree with the assessment and plan as dictated above.  Lloyd Huger, MD 06/01/2015 7:32 AM

## 2015-06-05 ENCOUNTER — Telehealth: Payer: Self-pay

## 2015-06-05 MED ORDER — PROCHLORPERAZINE MALEATE 10 MG PO TABS: 10.0000 mg | ORAL_TABLET | Freq: Four times a day (QID) | ORAL | Status: DC | PRN

## 2015-06-05 NOTE — Telephone Encounter (Signed)
Received a call from patient earlier today regarding nausea medication.  Also received a call from [employer] regarding the Zofran being a category X witht he Tasigna due to increased QT prolongation with both medications.    I spoke with Dr. Burlene Arnt, on call physician, who recommends to change the Zofran to Compazine 10mg  Q6-8H prn.  Patient is aware of med change.

## 2015-06-09 ENCOUNTER — Inpatient Hospital Stay
Admission: EM | Admit: 2015-06-09 | Discharge: 2015-06-12 | DRG: 542 | Disposition: A | Discharge status: Hospice | Payer: Medicare Other | Attending: Internal Medicine | Admitting: Internal Medicine

## 2015-06-09 ENCOUNTER — Encounter: Payer: Self-pay | Admitting: Internal Medicine

## 2015-06-09 ENCOUNTER — Telehealth: Payer: Self-pay | Admitting: Hematology and Oncology

## 2015-06-09 DIAGNOSIS — E860 Dehydration: Secondary | ICD-10-CM | POA: Diagnosis present

## 2015-06-09 DIAGNOSIS — Z9221 Personal history of antineoplastic chemotherapy: Secondary | ICD-10-CM

## 2015-06-09 DIAGNOSIS — R112 Nausea with vomiting, unspecified: Secondary | ICD-10-CM

## 2015-06-09 DIAGNOSIS — R748 Abnormal levels of other serum enzymes: Secondary | ICD-10-CM | POA: Diagnosis not present

## 2015-06-09 DIAGNOSIS — C49A0 Gastrointestinal stromal tumor, unspecified site: Principal | ICD-10-CM | POA: Diagnosis present

## 2015-06-09 DIAGNOSIS — R7989 Other specified abnormal findings of blood chemistry: Secondary | ICD-10-CM | POA: Diagnosis present

## 2015-06-09 DIAGNOSIS — G893 Neoplasm related pain (acute) (chronic): Secondary | ICD-10-CM | POA: Diagnosis present

## 2015-06-09 DIAGNOSIS — E876 Hypokalemia: Secondary | ICD-10-CM | POA: Diagnosis present

## 2015-06-09 DIAGNOSIS — R1110 Vomiting, unspecified: Secondary | ICD-10-CM | POA: Diagnosis not present

## 2015-06-09 DIAGNOSIS — Z8249 Family history of ischemic heart disease and other diseases of the circulatory system: Secondary | ICD-10-CM

## 2015-06-09 DIAGNOSIS — Z880 Allergy status to penicillin: Secondary | ICD-10-CM

## 2015-06-09 DIAGNOSIS — K831 Obstruction of bile duct: Secondary | ICD-10-CM

## 2015-06-09 DIAGNOSIS — M1990 Unspecified osteoarthritis, unspecified site: Secondary | ICD-10-CM | POA: Diagnosis present

## 2015-06-09 DIAGNOSIS — C49A9 Gastrointestinal stromal tumor of other sites: Secondary | ICD-10-CM | POA: Diagnosis not present

## 2015-06-09 DIAGNOSIS — Z888 Allergy status to other drugs, medicaments and biological substances status: Secondary | ICD-10-CM

## 2015-06-09 DIAGNOSIS — E8809 Other disorders of plasma-protein metabolism, not elsewhere classified: Secondary | ICD-10-CM | POA: Diagnosis not present

## 2015-06-09 DIAGNOSIS — Z885 Allergy status to narcotic agent status: Secondary | ICD-10-CM | POA: Diagnosis not present

## 2015-06-09 DIAGNOSIS — Z66 Do not resuscitate: Secondary | ICD-10-CM | POA: Diagnosis present

## 2015-06-09 LAB — COMPREHENSIVE METABOLIC PANEL
ALK PHOS: 970 U/L — AB (ref 38–126)
ALT: 108 U/L — AB (ref 17–63)
AST: 121 U/L — AB (ref 15–41)
Albumin: 2.5 g/dL — ABNORMAL LOW (ref 3.5–5.0)
Anion gap: 12 (ref 5–15)
BILIRUBIN TOTAL: 11.3 mg/dL — AB (ref 0.3–1.2)
BUN: 27 mg/dL — AB (ref 6–20)
CALCIUM: 8.3 mg/dL — AB (ref 8.9–10.3)
CO2: 26 mmol/L (ref 22–32)
CREATININE: 0.89 mg/dL (ref 0.61–1.24)
Chloride: 100 mmol/L — ABNORMAL LOW (ref 101–111)
Glucose, Bld: 125 mg/dL — ABNORMAL HIGH (ref 65–99)
Potassium: 3.2 mmol/L — ABNORMAL LOW (ref 3.5–5.1)
Sodium: 138 mmol/L (ref 135–145)
Total Protein: 6.3 g/dL — ABNORMAL LOW (ref 6.5–8.1)

## 2015-06-09 LAB — CBC
HCT: 37.5 % — ABNORMAL LOW (ref 40.0–52.0)
Hemoglobin: 13.2 g/dL (ref 13.0–18.0)
MCH: 31.7 pg (ref 26.0–34.0)
MCHC: 35.2 g/dL (ref 32.0–36.0)
MCV: 90.1 fL (ref 80.0–100.0)
PLATELETS: 481 10*3/uL — AB (ref 150–440)
RBC: 4.16 MIL/uL — AB (ref 4.40–5.90)
RDW: 16.6 % — AB (ref 11.5–14.5)
WBC: 8.9 10*3/uL (ref 3.8–10.6)

## 2015-06-09 LAB — LIPASE, BLOOD: LIPASE: 25 U/L (ref 11–51)

## 2015-06-09 MED ORDER — VITAMIN C 500 MG PO TABS
500.0000 mg | ORAL_TABLET | Freq: Every day | ORAL | Status: DC
  Filled 2015-06-09: qty 1

## 2015-06-09 MED ORDER — MEGESTROL ACETATE 40 MG PO TABS
40.0000 mg | ORAL_TABLET | Freq: Every day | ORAL | Status: DC
  Filled 2015-06-09: qty 1

## 2015-06-09 MED ORDER — OXYCODONE HCL 5 MG PO TABS: 10.0000 mg | ORAL_TABLET | Freq: Four times a day (QID) | ORAL | Status: DC | PRN

## 2015-06-09 MED ORDER — ADULT MULTIVITAMIN W/MINERALS CH
1.0000 | ORAL_TABLET | Freq: Every day | ORAL | Status: DC
  Filled 2015-06-09: qty 1

## 2015-06-09 MED ORDER — NILOTINIB HCL 200 MG PO CAPS: 200.0000 mg | ORAL_CAPSULE | Freq: Two times a day (BID) | ORAL | Status: DC

## 2015-06-09 MED ORDER — TURMERIC 500 MG PO CAPS: ORAL_CAPSULE | Freq: Every day | ORAL | Status: DC

## 2015-06-09 MED ORDER — HEPARIN SODIUM (PORCINE) 5000 UNIT/ML IJ SOLN
5000.0000 [IU] | Freq: Three times a day (TID) | INTRAMUSCULAR | Status: DC
  Administered 2015-06-10 – 2015-06-11 (×4): 5000 [IU] via SUBCUTANEOUS
  Filled 2015-06-09 (×5): qty 1

## 2015-06-09 MED ORDER — POTASSIUM CHLORIDE IN NACL 40-0.9 MEQ/L-% IV SOLN
INTRAVENOUS | Status: DC
  Administered 2015-06-10 (×3): 100 mL/h via INTRAVENOUS
  Administered 2015-06-11: 18:00:00 75 mL/h via INTRAVENOUS
  Administered 2015-06-11: 100 mL/h via INTRAVENOUS
  Administered 2015-06-12: 75 mL/h via INTRAVENOUS
  Filled 2015-06-09 (×8): qty 1000

## 2015-06-09 MED ORDER — SODIUM CHLORIDE 0.9 % IV BOLUS (SEPSIS)
1000.0000 mL | Freq: Once | INTRAVENOUS | Status: AC
  Administered 2015-06-09: 1000 mL via INTRAVENOUS

## 2015-06-09 MED ORDER — ONDANSETRON HCL 4 MG PO TABS: 8.0000 mg | ORAL_TABLET | Freq: Three times a day (TID) | ORAL | Status: DC | PRN

## 2015-06-09 MED ORDER — METOCLOPRAMIDE HCL 5 MG/ML IJ SOLN
5.0000 mg | Freq: Four times a day (QID) | INTRAMUSCULAR | Status: DC | PRN
Start: 2015-06-09 — End: 2015-06-10

## 2015-06-09 MED ORDER — PROMETHAZINE HCL 25 MG/ML IJ SOLN
25.0000 mg | Freq: Once | INTRAMUSCULAR | Status: DC
Start: 2015-06-09 — End: 2015-06-12
  Filled 2015-06-09: qty 1

## 2015-06-09 MED ORDER — VITAMIN E 180 MG (400 UNIT) PO CAPS
400.0000 [IU] | ORAL_CAPSULE | Freq: Every day | ORAL | Status: DC
  Filled 2015-06-09: qty 1

## 2015-06-09 NOTE — ED Provider Notes (Signed)
[facility] Emergency Department Provider Note  ____________________________________________    I have reviewed the triage vital signs and the nursing notes.   HISTORY  Chief Complaint Emesis    HPI Curtis Valentine is a 70 y.o. male who presents with complaints of nausea vomiting and dehydration. 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 He attempted an outpatient IR stent procedure but was unsuccessful. He comes in today because he has been unable tolerate by mouth's all day long and feels very dehydrated and weak. He denies fevers or chills. He does not have abdominal pain currently     Past Medical History  Diagnosis Date  . 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 Arthritis   . GIST (gastrointestinal stroma tumor), malignant, colon [facility])     Patient Active Problem List   Diagnosis Date Noted  . GIST, malignant ([place]) 03/22/2015    Past Surgical History  Procedure Laterality Date  . Gist tumor surgery      Current Outpatient Rx  Name  Route  Sig  Dispense  Refill  . celecoxib (CELEBREX) 200 MG capsule   Oral   Take 200 mg by mouth daily.         Marland Kitchen esomeprazole (NEXIUM) 40 MG capsule   Oral   Take 40 mg by mouth daily at 12 noon.         . furosemide (LASIX) 20 MG tablet   Oral   Take 1 tablet (20 mg total) by mouth daily.   30 tablet   2   . Glucosamine-Chondroitin-Ca-D3 (TRIPLE FLEX 50+ PO)   Oral   Take by mouth.         Marland Kitchen LORazepam (ATIVAN) 0.5 MG tablet   Oral   Take 0.5 mg by mouth every 8 (eight) hours as needed for anxiety.         . megestrol (MEGACE) 40 MG tablet   Oral   Take 1 tablet (40 mg total) by mouth daily.   30 tablet   0   . Multiple Vitamins-Minerals (MULTIVITAMIN MEN PO)   Oral   Take 1 tablet by mouth.         . nilotinib (TASIGNA) 200 MG capsule   Oral   Take 1 capsule (200 mg total) by mouth every 12 (twelve)  hours. Give on an empty stomach 1 hour before or 2 hours after meals.   60 capsule   0   . Omega-3 Fatty Acids (FISH OIL) 1000 MG CAPS   Oral   Take by mouth.         . ondansetron (ZOFRAN) 8 MG tablet   Oral   Take 1 tablet (8 mg total) by mouth every 8 (eight) hours as needed for nausea or vomiting. Patient not taking: Reported on 06/05/2015   60 tablet   2   . Oxycodone HCl 10 MG TABS   Oral   Take 1 tablet (10 mg total) by mouth every 6 (six) hours as needed.   90 tablet   0   . prochlorperazine (COMPAZINE) 10 MG tablet   Oral   Take 1 tablet (10 mg total) by mouth every 6 (six) hours as needed for nausea or vomiting.   30 tablet   2   . traMADol (ULTRAM) 50 MG tablet   Oral   Take 1 tablet (50 mg total) by mouth every  6 (six) hours as needed for moderate pain.   30 tablet   0   . vitamin C (ASCORBIC ACID) 500 MG tablet   Oral   Take 500 mg by mouth daily.         . vitamin E 400 UNIT capsule   Oral   Take 400 Units by mouth daily.           Allergies Promethazine; Morphine and related; and Penicillins  No family history on file.  Social History Social History  Substance Use Topics  . Smoking status: Never Smoker   . Smokeless tobacco: Never Used  . Alcohol Use: Yes     Comment: wine-occasional    Review of Systems  Constitutional: Negative for fever. Eyes: Negative for visual changes. ENT: Negative for sore throat Cardiovascular: Negative for chest pain. Respiratory: Negative for cough Gastrointestinal: Vomiting Genitourinary: Negative for dysuria. Musculoskeletal: Negative for back pain. Skin: Positive for jaundice Neurological: Negative for  focal weakness Psychiatric: Mild anxiety    ____________________________________________   PHYSICAL EXAM:  VITAL SIGNS: ED Triage Vitals  Enc Vitals Group     BP 06/09/15 1453 122/67 mmHg     Pulse Rate 06/09/15 1453 102     Resp 06/09/15 1453 18     Temp 06/09/15 1453 98.1 F (36.7  C)     Temp Source 06/09/15 1453 Oral     SpO2 06/09/15 1453 96 %     Weight 06/09/15 1453 126 lb (57.153 kg)     Height 06/09/15 1453 5\' 8"  (1.727 m)     Head Cir --      Peak Flow --      Pain Score 06/09/15 1854 0     Pain Loc --      Pain Edu? --      Excl. in [place]? --     Constitutional: Alert and oriented. Chronically ill-appearing Eyes: Conjunctivae are icteric ENT   Head: Normocephalic and atraumatic.   Mouth/Throat: Mucous membranes are moist. Cardiovascular: Normal rate, regular rhythm. Normal and symmetric distal pulses are present in all extremities.  Respiratory: Normal respiratory effort without tachypnea nor retractions.  Gastrointestinal: Large palpable mass underlying umbilicus and upper abdomen. No tenderness to palpation. There is no CVA tenderness. Genitourinary: deferred Musculoskeletal: Nontender with normal range of motion in all extremities. No lower extremity tenderness nor edema. Neurologic:  Normal speech and language. No gross focal neurologic deficits are appreciated. Skin:  Skin is warm, dry and intact. No rash noted. Psychiatric: Mood and affect are normal. Patient exhibits appropriate insight and judgment.  ____________________________________________    LABS (pertinent positives/negatives)  Labs Reviewed  COMPREHENSIVE METABOLIC PANEL - Abnormal; Notable for the following:    Potassium 3.2 (*)    Chloride 100 (*)    Glucose, Bld 125 (*)    BUN 27 (*)    Calcium 8.3 (*)    Total Protein 6.3 (*)    Albumin 2.5 (*)    AST 121 (*)    ALT 108 (*)    Alkaline Phosphatase 970 (*)    Total Bilirubin 11.3 (*)    All other components within normal limits  CBC - Abnormal; Notable for the following:    RBC 4.16 (*)    HCT 37.5 (*)    RDW 16.6 (*)    Platelets 481 (*)    All other components within normal limits  LIPASE, BLOOD  URINALYSIS COMPLETEWITH MICROSCOPIC ([facility] ONLY)     ____________________________________________   EKG  None  ____________________________________________    RADIOLOGY I have personally reviewed any xrays that were ordered on this patient: None  ____________________________________________   PROCEDURES  Procedure(s) performed: none  Critical Care performed: none  ____________________________________________   INITIAL IMPRESSION / ASSESSMENT AND PLAN / ED COURSE  Pertinent labs & imaging results that were available during my care of the patient were reviewed by me and considered in my medical decision making (see chart for details).  Patient presents with inability to tolerate by mouth's and referred in by Dr. Mike Gip of oncology. He reports he is unable to eat or drink anything as it seems to come right back up again. We will give IV fluids and antiemetics I feel the patient should come into the hospital. I discussed this with Dr. Mike Gip who agrees the patient needs admission  ____________________________________________   FINAL CLINICAL IMPRESSION(S) / ED DIAGNOSES  Final diagnoses:  Dehydration  Intractable vomiting with nausea, vomiting of unspecified type     Lavonia Drafts, MD 06/09/15 2035

## 2015-06-09 NOTE — ED Notes (Signed)
Provided pt with extra pillow and updated on wait.  No other questions or concerns at this time.

## 2015-06-09 NOTE — Progress Notes (Signed)
PHARMACIST - PHYSICIAN ORDER COMMUNICATION  CONCERNING: P&T Medication Policy on Herbal Medications  DESCRIPTION:  This patient's order for:  tumeric  has been noted.  This product(s) is classified as an "herbal" or natural product. Due to a lack of definitive safety studies or FDA approval, nonstandard manufacturing practices, plus the potential risk of unknown drug-drug interactions while on inpatient medications, the Pharmacy and Therapeutics Committee does not permit the use of "herbal" or natural products of this type within [place].   ACTION TAKEN: The pharmacy department is unable to verify this order at this time. Please reevaluate patient's clinical condition at discharge and address if the herbal or natural product(s) should be resumed at that time.   

## 2015-06-09 NOTE — ED Notes (Signed)
Pt reports vomiting for past 3 days multiple episodes unable to keep fluids down. Denies diarrhea. Denies fever. CA of the abd receives chemo therapy.

## 2015-06-09 NOTE — Telephone Encounter (Signed)
Re:  Poor oral intake  Spoke with patient's brother-in-law, Mikki Santee, about poor oral intake x 3 days.  He is taking sips.  Still voiding and having bowel movements.  He does not want to come in to the ER and be assessed and given IV fluids and determine if admission needed.  He would like to be seen tomorrow morning in clinic by Dr. Grayland Ormond.  Encourage family member to bring him to the ER is he is not doing well.  Lequita Asal, MD

## 2015-06-09 NOTE — H&P (Signed)
[place] [facility] [place] NAME: Curtis Valentine    MR#:  [id number]  DATE OF BIRTH:  [birth year]  DATE OF ADMISSION:  06/09/2015  PRIMARY CARE PHYSICIAN: Rusty Aus, MD   REQUESTING/REFERRING PHYSICIAN: [email or web address]  CHIEF COMPLAINT:   Chief Complaint  Patient presents with  . Emesis    HISTORY OF PRESENT ILLNESS: Curtis Valentine  is a 70 y.o. 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 For last 4-5 days his severe nausea and vomiting, unable to tolerate any fluid or food orally. In emergency room noted to have elevated LFTs and bilirubin, and hypokalemia.  PAST MEDICAL HISTORY:   Past Medical History  Diagnosis Date  . 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 Arthritis   . GIST (gastrointestinal stroma tumor), malignant, colon ([place])     PAST SURGICAL HISTORY:  Past Surgical History  Procedure Laterality Date  . Gist tumor surgery      SOCIAL HISTORY:  Social History  Substance Use Topics  . Smoking status: Never Smoker   . Smokeless tobacco: Never Used  . Alcohol Use: Yes     Comment: wine-occasional    FAMILY HISTORY:  Family History  Problem Relation Age of Onset  . Hypertension Father     DRUG ALLERGIES:  Allergies  Allergen Reactions  . Promethazine Nausea Only    During administration, pt felt more nauseated and refused remainder of dose.  Pt requested promethazine be added to allergy list.   . Morphine And Related Other (See Comments)    Very confused.   Marland Kitchen Nexium [Esomeprazole] Other (See Comments)    Interaction with cancer drug "Tasigna"  . Penicillins Hives    Itch and breakout in hives. Has patient had a PCN reaction causing immediate rash, facial/tongue/throat swelling, SOB or lightheadedness with hypotension: Yes Has patient had a PCN reaction causing severe rash involving mucus membranes or skin  necrosis: No Has patient had a PCN reaction that required hospitalization No Has patient had a PCN reaction occurring within the last 10 years: Yes If all of the above answers are "NO", then may proceed with Cephalosporin use.  Marland Kitchen Prochlorperazine Nausea And Vomiting    Patient states this makes him vomit more.  Marland Kitchen Zofran [Ondansetron Hcl] Nausea And Vomiting    Patient states this makes him vomit more.    REVIEW OF SYSTEMS:   CONSTITUTIONAL: No fever, fatigue or weakness.  EYES: No blurred or double vision.  EARS, NOSE, AND THROAT: No tinnitus or ear pain.  RESPIRATORY: No cough, shortness of breath, wheezing or hemoptysis.  CARDIOVASCULAR: No chest pain, orthopnea, edema.  GASTROINTESTINAL: positive nausea, vomiting,no diarrhea or abdominal pain.  GENITOURINARY: No dysuria, hematuria.  ENDOCRINE: No polyuria, nocturia,  HEMATOLOGY: No anemia, easy bruising or bleeding SKIN: No rash or lesion. MUSCULOSKELETAL: No joint pain or arthritis.   NEUROLOGIC: No tingling, numbness, weakness.  PSYCHIATRY: No anxiety or depression.   MEDICATIONS AT HOME:  Prior to Admission medications   Medication Sig Start Date End Date Taking? Authorizing Provider  celecoxib (CELEBREX) 200 MG capsule Take 200 mg by mouth daily.   Yes Historical Provider, MD  Glucosamine-Chondroitin-Ca-D3 (TRIPLE FLEX 50+ PO) Take 1 tablet by mouth daily.    Yes Historical Provider, MD  LORazepam (ATIVAN) 0.5 MG tablet Take 0.5 mg by mouth every 8 (eight)  hours as needed for anxiety.   Yes Historical Provider, MD  LUMIGAN 0.01 % SOLN Apply 1 drop to eye at bedtime. 04/22/15  Yes Historical Provider, MD  megestrol (MEGACE) 40 MG tablet Take 1 tablet (40 mg total) by mouth daily. 05/27/15  Yes Mayra Reel, NP  Multiple Vitamins-Minerals (MULTIVITAMIN MEN PO) Take 1 tablet by mouth daily.    Yes Historical Provider, MD  nilotinib (TASIGNA) 200 MG capsule Take 1 capsule (200 mg total) by mouth every 12 (twelve) hours. Give on  an empty stomach 1 hour before or 2 hours after meals. 05/13/15  Yes Lloyd Huger, MD  Omega-3 Fatty Acids (FISH OIL) 1000 MG CAPS Take 1,000 mg by mouth daily.    Yes Historical Provider, MD  Oxycodone HCl 10 MG TABS Take 1 tablet (10 mg total) by mouth every 6 (six) hours as needed. 05/20/15  Yes Lloyd Huger, MD  traMADol (ULTRAM) 50 MG tablet Take 1 tablet (50 mg total) by mouth every 6 (six) hours as needed for moderate pain. 05/27/15  Yes Mayra Reel, NP  TURMERIC PO Take 1 tablet by mouth daily.   Yes Historical Provider, MD  vitamin C (ASCORBIC ACID) 500 MG tablet Take 500 mg by mouth daily.   Yes Historical Provider, MD  vitamin E 400 UNIT capsule Take 400 Units by mouth daily.   Yes Historical Provider, MD  furosemide (LASIX) 20 MG tablet Take 1 tablet (20 mg total) by mouth daily. 04/23/15   Lloyd Huger, MD  ondansetron (ZOFRAN) 8 MG tablet Take 1 tablet (8 mg total) by mouth every 8 (eight) hours as needed for nausea or vomiting. Patient not taking: Reported on 06/05/2015 03/22/15   Lloyd Huger, MD  prochlorperazine (COMPAZINE) 10 MG tablet Take 1 tablet (10 mg total) by mouth every 6 (six) hours as needed for nausea or vomiting. 06/05/15   Cammie Sickle, MD      PHYSICAL EXAMINATION:   VITAL SIGNS: Blood pressure 119/56, pulse 97, temperature 98.1 F (36.7 C), temperature source Oral, resp. rate 16, height 5\' 8"  (1.727 m), weight 57.153 kg (126 lb), SpO2 98 %.  GENERAL:  70 y.o.-year-old thin thin patient lying in the bed with no acute distress.  EYES: Pupils equal, round, reactive to light and accommodation.  Positive scleral icterus. Extraocular muscles intact.  HEENT: Head atraumatic, normocephalic. Oropharynx and nasopharynx clear.  NECK:  Supple, no jugular venous distention. No thyroid enlargement, no tenderness.  LUNGS: Normal breath sounds bilaterally, no wheezing, rales,rhonchi or crepitation. No use of accessory muscles of respiration.   CARDIOVASCULAR: S1, S2 normal. No murmurs, rubs, or gallops.  ABDOMEN: Soft, tender, distended. Bowel sounds present, epigastric and right upper quadrant mass present  EXTREMITIES: No pedal edema, cyanosis, or clubbing. Thin limbs. NEUROLOGIC: Cranial nerves II through XII are intact. Muscle strength 5/5 in all extremities. Sensation intact. Gait not checked.  PSYCHIATRIC: The patient is alert and oriented x 3.  SKIN: No obvious rash, lesion, or ulcer.   LABORATORY PANEL:   CBC  Recent Labs Lab 06/09/15 1507  WBC 8.9  HGB 13.2  HCT 37.5*  PLT 481*  MCV 90.1  MCH 31.7  MCHC 35.2  RDW 16.6*   ------------------------------------------------------------------------------------------------------------------  Chemistries   Recent Labs Lab 06/09/15 1507  NA 138  K 3.2*  CL 100*  CO2 26  GLUCOSE 125*  BUN 27*  CREATININE 0.89  CALCIUM 8.3*  AST 121*  ALT 108*  ALKPHOS 970*  BILITOT 11.3*   ------------------------------------------------------------------------------------------------------------------  estimated creatinine clearance is 62.5 mL/min (by C-G formula based on Cr of 0.89). ------------------------------------------------------------------------------------------------------------------ No results for input(s): TSH, T4TOTAL, T3FREE, THYROIDAB in the last 72 hours.  Invalid input(s): FREET3   Coagulation profile No results for input(s): INR, PROTIME in the last 168 hours. ------------------------------------------------------------------------------------------------------------------- No results for input(s): DDIMER in the last 72 hours. -------------------------------------------------------------------------------------------------------------------  Cardiac Enzymes No results for input(s): CKMB, TROPONINI, MYOGLOBIN in the last 168 hours.  Invalid input(s):  CK ------------------------------------------------------------------------------------------------------------------ Invalid input(s): POCBNP  ---------------------------------------------------------------------------------------------------------------  Urinalysis    Component Value Date/Time   COLORURINE AMBER* 05/27/2015 1010   APPEARANCEUR CLEAR* 05/27/2015 1010   LABSPEC 1.018 05/27/2015 1010   PHURINE 5.0 05/27/2015 1010   GLUCOSEU NEGATIVE 05/27/2015 1010   HGBUR NEGATIVE 05/27/2015 1010   BILIRUBINUR 1+* 05/27/2015 1010   KETONESUR NEGATIVE 05/27/2015 1010   PROTEINUR NEGATIVE 05/27/2015 1010   NITRITE NEGATIVE 05/27/2015 1010   LEUKOCYTESUR NEGATIVE 05/27/2015 1010     RADIOLOGY: No results found.  EKG: No orders found for this or any previous visit.  IMPRESSION AND PLAN:  *  intractable nausea and vomiting   . We'll give Reglan IV as needed.   This is secondary to obstructive jaundice, and the reason is his tumor.   I will let oncology had addressed this issue, most likely as the patient says it is rapidly progressive, he may need palliative care intervention.  * GI ST   He is currently on oral chemotherapy.   Oncology consult for further management, this appears to be very aggressive and rapidly progressive.   I will wait for oncology guidelines and possibly he may benefit from palliative care intervention.  * Hypokalemia  Give IV fluids with potassium, recheck tomorrow.  * Obstructive jaundice    continue monitoring with IV fluid.   All the records are reviewed and case discussed with ED provider. Management plans discussed with the patient, family and they are in agreement.  CODE STATUS: DO NOT RESUSCITATE  Code Status History    This patient does not have a recorded code status. Please follow your organizational policy for patients in this situation.      PLAN DISCUSSED WITH PATIENT'S BROTHER WHO IS PRESENT IN THE ROOM,   his healthcare power  of attorney is his sister.  TOTAL TIME TAKING CARE OF THIS PATIENT : 40minutes.    Vaughan Basta M.D on 06/09/2015   Between 7am to 6pm - Pager - [phone]  After 6pm go to [email or web address] - password [email or web address] [facility]  Office  [phone]  CC: Primary care physician; Rusty Aus, MD   Note: This dictation was prepared with Dragon dictation along with smaller phrase technology. Any transcriptional errors that result from this process are unintentional.

## 2015-06-09 NOTE — ED Notes (Signed)
Pt states that he had another vomiting episode earlier.  Pt resting comfortably at this time.

## 2015-06-09 NOTE — ED Notes (Signed)
Pt received approximately 12.5mg  phenergan diluted in NS.  Pt stated that he was starting to feel more nauseated and refused remainder of dose.  Pt states that he is afraid because of his history with antiemetics.

## 2015-06-10 ENCOUNTER — Other Ambulatory Visit: Payer: Medicare Other

## 2015-06-10 ENCOUNTER — Ambulatory Visit: Payer: Medicare Other | Admitting: Oncology

## 2015-06-10 DIAGNOSIS — Z79899 Other long term (current) drug therapy: Secondary | ICD-10-CM

## 2015-06-10 DIAGNOSIS — C49A9 Gastrointestinal stromal tumor of other sites: Secondary | ICD-10-CM

## 2015-06-10 DIAGNOSIS — F419 Anxiety disorder, unspecified: Secondary | ICD-10-CM

## 2015-06-10 DIAGNOSIS — R531 Weakness: Secondary | ICD-10-CM

## 2015-06-10 DIAGNOSIS — R748 Abnormal levels of other serum enzymes: Secondary | ICD-10-CM

## 2015-06-10 DIAGNOSIS — Z9221 Personal history of antineoplastic chemotherapy: Secondary | ICD-10-CM

## 2015-06-10 DIAGNOSIS — E8809 Other disorders of plasma-protein metabolism, not elsewhere classified: Secondary | ICD-10-CM

## 2015-06-10 DIAGNOSIS — R634 Abnormal weight loss: Secondary | ICD-10-CM

## 2015-06-10 DIAGNOSIS — R1110 Vomiting, unspecified: Secondary | ICD-10-CM

## 2015-06-10 DIAGNOSIS — M549 Dorsalgia, unspecified: Secondary | ICD-10-CM

## 2015-06-10 DIAGNOSIS — R630 Anorexia: Secondary | ICD-10-CM

## 2015-06-10 LAB — URINALYSIS COMPLETE WITH MICROSCOPIC (ARMC ONLY)
Bacteria, UA: NONE SEEN
Glucose, UA: NEGATIVE mg/dL
Hgb urine dipstick: NEGATIVE
Leukocytes, UA: NEGATIVE
Nitrite: NEGATIVE
PH: 6 (ref 5.0–8.0)
Protein, ur: NEGATIVE mg/dL
Specific Gravity, Urine: 1.017 (ref 1.005–1.030)

## 2015-06-10 LAB — CBC
HEMATOCRIT: 35 % — AB (ref 40.0–52.0)
HEMOGLOBIN: 11.9 g/dL — AB (ref 13.0–18.0)
MCH: 31.6 pg (ref 26.0–34.0)
MCHC: 34.1 g/dL (ref 32.0–36.0)
MCV: 92.7 fL (ref 80.0–100.0)
Platelets: 389 10*3/uL (ref 150–440)
RBC: 3.78 MIL/uL — ABNORMAL LOW (ref 4.40–5.90)
RDW: 16.4 % — ABNORMAL HIGH (ref 11.5–14.5)
WBC: 7.3 10*3/uL (ref 3.8–10.6)

## 2015-06-10 LAB — HEPATIC FUNCTION PANEL
ALT: 91 U/L — ABNORMAL HIGH (ref 17–63)
AST: 117 U/L — ABNORMAL HIGH (ref 15–41)
Albumin: 2.1 g/dL — ABNORMAL LOW (ref 3.5–5.0)
Alkaline Phosphatase: 788 U/L — ABNORMAL HIGH (ref 38–126)
BILIRUBIN TOTAL: 9.9 mg/dL — AB (ref 0.3–1.2)
Bilirubin, Direct: 6 mg/dL — ABNORMAL HIGH (ref 0.1–0.5)
Indirect Bilirubin: 3.9 mg/dL — ABNORMAL HIGH (ref 0.3–0.9)
Total Protein: 5.2 g/dL — ABNORMAL LOW (ref 6.5–8.1)

## 2015-06-10 LAB — BASIC METABOLIC PANEL
Anion gap: 9 (ref 5–15)
BUN: 23 mg/dL — ABNORMAL HIGH (ref 6–20)
CO2: 24 mmol/L (ref 22–32)
Calcium: 7.3 mg/dL — ABNORMAL LOW (ref 8.9–10.3)
Chloride: 108 mmol/L (ref 101–111)
Creatinine, Ser: 0.72 mg/dL (ref 0.61–1.24)
GFR calc Af Amer: >60 mL/min (ref >60)
GFR calc non Af Amer: >60 mL/min (ref >60)
GLUCOSE: 85 mg/dL (ref 65–99)
POTASSIUM: 3.3 mmol/L — AB (ref 3.5–5.1)
Sodium: 141 mmol/L (ref 135–145)

## 2015-06-10 MED ORDER — DEXAMETHASONE SODIUM PHOSPHATE 10 MG/ML IJ SOLN
10.0000 mg | Freq: Two times a day (BID) | INTRAMUSCULAR | Status: DC
  Administered 2015-06-10 – 2015-06-11 (×3): 10 mg via INTRAVENOUS
  Filled 2015-06-10 (×3): qty 1

## 2015-06-10 MED ORDER — LORAZEPAM 2 MG/ML IJ SOLN
0.5000 mg | Freq: Every day | INTRAMUSCULAR | Status: DC
  Administered 2015-06-10 – 2015-06-11 (×2): 0.5 mg via INTRAVENOUS
  Filled 2015-06-10 (×2): qty 1

## 2015-06-10 MED ORDER — LORAZEPAM 2 MG/ML IJ SOLN: 0.0500 mg/kg | Freq: Every morning | INTRAVENOUS | Status: DC

## 2015-06-10 MED ORDER — METOCLOPRAMIDE HCL 5 MG/ML IJ SOLN
5.0000 mg | Freq: Four times a day (QID) | INTRAMUSCULAR | Status: DC
  Administered 2015-06-10 – 2015-06-11 (×4): 5 mg via INTRAVENOUS
  Filled 2015-06-10 (×4): qty 2

## 2015-06-10 NOTE — Consult Note (Signed)
[place]  [phone] [phone]) [phone]  ID: Carin Primrose OB: Jun 16, 1945  MR#: [id number]  [id number]  Patient Care Team: Rusty Aus, MD as PCP - General (Internal Medicine)  CHIEF COMPLAINT:  Chief Complaint  Patient presents with  . Emesis    INTERVAL HISTORY: Patient is a 70 year old male with progressive high grade GIST who recently started oral Tasigna approximately 3 weeks ago. Over the past week he became progressively more nauseous with vomiting with minimal oral intake. He has not been able to take his chemotherapy in 3-4 days. He also has a significant amount of weight loss as well as progressive weakness and fatigue with declining performance status. He does not complain of abdominal pain today. He has no neurologic complaints. He denies any chest pain or shortness of breath. He has no urinary complaints. Patient feels generally terrible, but offers no further specific complaints.  REVIEW OF SYSTEMS:   Review of Systems  Constitutional: Positive for weight loss. Negative for fever.  Respiratory: Negative.  Negative for shortness of breath.   Cardiovascular: Negative.  Negative for chest pain.  Gastrointestinal: Positive for heartburn, nausea and vomiting. Negative for abdominal pain, diarrhea and constipation.  Genitourinary: Negative.   Musculoskeletal: Positive for back pain.  Neurological: Positive for weakness.  Psychiatric/Behavioral: The patient is nervous/anxious.     As per HPI. Otherwise, a complete review of systems is negatve.  PAST MEDICAL HISTORY: Past Medical History  Diagnosis Date  . Cancer [facility])     GIST 2011 per pt  . Arthritis   . GIST (gastrointestinal stroma tumor), malignant, colon ([place])     PAST SURGICAL HISTORY: Past Surgical History  Procedure Laterality Date  . Gist tumor surgery      FAMILY HISTORY Family History  Problem Relation Age of Onset  . Hypertension Father        ADVANCED  DIRECTIVES:    HEALTH MAINTENANCE: Social History  Substance Use Topics  . Smoking status: Never Smoker   . Smokeless tobacco: Never Used  . Alcohol Use: Yes     Comment: wine-occasional     Colonoscopy:  PAP:  Bone density:  Lipid panel:  Allergies  Allergen Reactions  . Promethazine Nausea Only    During administration, pt felt more nauseated and refused remainder of dose.  Pt requested promethazine be added to allergy list.   . Morphine And Related Other (See Comments)    Very confused.   Marland Kitchen Nexium [Esomeprazole] Other (See Comments)    Interaction with cancer drug "Tasigna"  . Penicillins Hives    Itch and breakout in hives. Has patient had a PCN reaction causing immediate rash, facial/tongue/throat swelling, SOB or lightheadedness with hypotension: Yes Has patient had a PCN reaction causing severe rash involving mucus membranes or skin necrosis: No Has patient had a PCN reaction that required hospitalization No Has patient had a PCN reaction occurring within the last 10 years: Yes If all of the above answers are "NO", then may proceed with Cephalosporin use.  Marland Kitchen Prochlorperazine Nausea And Vomiting    Patient states this makes him vomit more.  Marland Kitchen Zofran [Ondansetron Hcl] Nausea And Vomiting    Patient states this makes him vomit more.    Current Facility-Administered Medications  Medication Dose Route Frequency Provider Last Rate Last Dose  . 0.9 % NaCl with KCl 40 mEq / L  infusion   Intravenous Continuous Vaughan Basta, MD 100 mL/hr at 06/10/15 1041 100 mL/hr at 06/10/15 1041  .  dexamethasone (DECADRON) injection 10 mg  10 mg Intravenous Q12H Henreitta Leber, MD   10 mg at 06/10/15 1141  . heparin injection 5,000 Units  5,000 Units Subcutaneous 3 times per day Vaughan Basta, MD   5,000 Units at 06/10/15 [id number]  . LORazepam (ATIVAN) injection 0.5 mg  0.5 mg Intravenous Daily Henreitta Leber, MD   0.5 mg at 06/10/15 1429  . megestrol (MEGACE) tablet 40 mg   40 mg Oral Daily Vaughan Basta, MD   40 mg at 06/10/15 1131  . metoCLOPramide (REGLAN) injection 5 mg  5 mg Intravenous 4 times per day Henreitta Leber, MD   5 mg at 06/10/15 1807  . multivitamin with minerals tablet 1 tablet  1 tablet Oral Daily Vaughan Basta, MD   1 tablet at 06/10/15 1132  . nilotinib (TASIGNA) capsule 200 mg  200 mg Oral Q12H Vaughan Basta, MD   200 mg at 06/10/15 0032  . ondansetron (ZOFRAN) tablet 8 mg  8 mg Oral Q8H PRN Vaughan Basta, MD      . oxyCODONE (Oxy IR/ROXICODONE) immediate release tablet 10 mg  10 mg Oral Q6H PRN Vaughan Basta, MD      . promethazine (PHENERGAN) injection 25 mg  25 mg Intravenous Once Lavonia Drafts, MD   12.5 mg at 06/09/15 2008  . vitamin C (ASCORBIC ACID) tablet 500 mg  500 mg Oral Daily Vaughan Basta, MD   500 mg at 06/10/15 1133  . vitamin E capsule 400 Units  400 Units Oral Daily Vaughan Basta, MD   400 Units at 06/10/15 1134    OBJECTIVE: Filed Vitals:   06/10/15 0544 06/10/15 1236  BP: 126/65 111/52  Pulse: 95 87  Temp: 98.8 F (37.1 C) 98.2 F (36.8 C)  Resp: 18 24     Body mass index is 21.41 kg/(m^2).    ECOG FS:3 - Symptomatic, >50% confined to bed  General: Thin, ill-appearing, no acute distress. Eyes: Pink conjunctiva, anicteric sclera. HEENT: Normocephalic, moist mucous membranes, clear oropharnyx. Lungs: Clear to auscultation bilaterally. Heart: Regular rate and rhythm. No rubs, murmurs, or gallops. Abdomen: Soft, nontender, mildly distended, normoactive bowel sounds. Musculoskeletal: No edema, cyanosis, or clubbing. Neuro: Alert, answering all questions appropriately. Cranial nerves grossly intact. Skin: No rashes or petechiae noted. Psych: Normal affect. Lymphatics: No cervical, calvicular, axillary or inguinal LAD.   LAB RESULTS:  Lab Results  Component Value Date   NA 141 06/10/2015   K 3.3* 06/10/2015   CL 108 06/10/2015   CO2 24 06/10/2015    GLUCOSE 85 06/10/2015   BUN 23* 06/10/2015   CREATININE 0.72 06/10/2015   CALCIUM 7.3* 06/10/2015   PROT 5.2* 06/10/2015   ALBUMIN 2.1* 06/10/2015   AST 117* 06/10/2015   ALT 91* 06/10/2015   ALKPHOS 788* 06/10/2015   BILITOT 9.9* 06/10/2015   GFRNONAA >60 06/10/2015   GFRAA >60 06/10/2015    Lab Results  Component Value Date   WBC 7.3 06/10/2015   NEUTROABS 5.6 05/27/2015   HGB 11.9* 06/10/2015   HCT 35.0* 06/10/2015   MCV 92.7 06/10/2015   PLT 389 06/10/2015     STUDIES: No results found.  ASSESSMENT: Progressive, high-grade just GIST. Now with declining performance status with intractable nausea and vomiting.  PLAN:    1. GIST: See clinic note dated February 16, 2013 for detailed history of his disease process. CT scan results from April 25, 2015 reviewed independently with significant increase in his known heterogeneous mass near the head of  the pancreas likely invading the liver. Patient is not a surgical candidate. Interventional radiology was unable to complete percutaneous bile duct cannulation. Patient cannot take Gleevec at this time given his worsening bilirubin. Given his worsening nausea and vomiting, he has also been instructed to discontinue Tasigna. Patient is considering hospice care possibly at a hospice home but will further like to discuss with his family prior to making a final decision.  2. Nausea and vomiting: Mildly improved, oral intake as tolerated. Continue current treatments. 3. Elevated liver enzymes: Multifactorial, including probable progression of disease. 4. Elevated bilirubin: Likely secondary to progression of disease. Percutaneous cannulation was unsuccessful and ERCP is not an option. 5. Pain: Continue current narcotic regimen as prescribed.  Patient is DO NOT RESUSCITATE/DO NOT INTUBATE.  Appreciate consult, will follow.    Lloyd Huger, MD   06/10/2015 6:34 PM

## 2015-06-10 NOTE — Care Management (Signed)
Spoke with Dr. Grayland Ormond. Curtis Valentine and sister, Curtis Valentine and Curtis Valentine, brother-in-law would like to explore Hospice Home discharge options. Gave list of Hospice Homes to sister. Curtis Valentine spoke up and stated he would like [facility]. Flo Shanks, RN representative updated. Will meet with family today. Last seen Dr. Sabra Heck about a year ago. No home health in the past. Skilled Nursing in [place] about 6 years ago following surgery. Uses a cane to aid in ambulation.  [facility] [phone]

## 2015-06-10 NOTE — Progress Notes (Signed)
At the request of [place] spoke with patient and his sisterSuzette regarding both hospice services in his home and the hospice home. Patient would like to think about these options and talk with his oncologist again before making a choice. He is currently unable to eat d/t nausea and vomiting.  He has lost a considerable amount of weight and has been unable to tolerate his oral chemo. Hospice information and writer's contact number given. CMRN Hassan Rowan advised of writers visit. Flo Shanks RN, BSN , [place] [facility], [facility] [phone] c

## 2015-06-10 NOTE — Progress Notes (Signed)
[place] at [place] NAME: Curtis Valentine    MR#:  [id number]  DATE OF BIRTH:  12/08/1945  SUBJECTIVE:   Patient is here due to intractable nausea and vomiting. Nausea vomiting has improved since yesterday with some Reglan.  REVIEW OF SYSTEMS:    Review of Systems  Constitutional: Negative for fever and chills.  HENT: Negative for congestion and tinnitus.   Eyes: Negative for blurred vision and double vision.  Respiratory: Negative for cough, shortness of breath and wheezing.   Cardiovascular: Negative for chest pain, orthopnea and PND.  Gastrointestinal: Positive for nausea, vomiting and abdominal pain. Negative for diarrhea.  Genitourinary: Negative for dysuria and hematuria.  Neurological: Positive for weakness (Generalized). Negative for dizziness, sensory change and focal weakness.  All other systems reviewed and are negative.   Nutrition: NPO Tolerating Diet: No Tolerating PT: Await Eval.      DRUG ALLERGIES:   Allergies  Allergen Reactions  . Promethazine Nausea Only    During administration, pt felt more nauseated and refused remainder of dose.  Pt requested promethazine be added to allergy list.   . Morphine And Related Other (See Comments)    Very confused.   Marland Kitchen Nexium [Esomeprazole] Other (See Comments)    Interaction with cancer drug "Tasigna"  . Penicillins Hives    Itch and breakout in hives. Has patient had a PCN reaction causing immediate rash, facial/tongue/throat swelling, SOB or lightheadedness with hypotension: Yes Has patient had a PCN reaction causing severe rash involving mucus membranes or skin necrosis: No Has patient had a PCN reaction that required hospitalization No Has patient had a PCN reaction occurring within the last 10 years: Yes If all of the above answers are "NO", then may proceed with Cephalosporin use.  Marland Kitchen Prochlorperazine Nausea And Vomiting    Patient states this makes him vomit  more.  Marland Kitchen Zofran [Ondansetron Hcl] Nausea And Vomiting    Patient states this makes him vomit more.    VITALS:  Blood pressure 126/65, pulse 95, temperature 98.8 F (37.1 C), temperature source Oral, resp. rate 18, height 5\' 8"  (1.727 m), weight 63.866 kg (140 lb 12.8 oz), SpO2 94 %.  PHYSICAL EXAMINATION:   Physical Exam  GENERAL:  70 y.o.-year-old cachectic jaundinced patient sitting up in chair in no acute distress.  EYES: Pupils equal, round, reactive to light and accommodation. + scleral icterus. Extraocular muscles intact.  HEENT: Head atraumatic, normocephalic. Oropharynx and nasopharynx clear.  NECK:  Supple, no jugular venous distention. No thyroid enlargement, no tenderness.  LUNGS: Normal breath sounds bilaterally, no wheezing, rales, rhonchi. No use of accessory muscles of respiration.  CARDIOVASCULAR: S1, S2 normal. No murmurs, rubs, or gallops.  ABDOMEN: Soft, mildly tender, no rebound, rigidity, distended. Bowel sounds present. No organomegaly or mass.  EXTREMITIES: No cyanosis, clubbing or edema b/l.    NEUROLOGIC: Cranial nerves II through XII are intact. No focal Motor or sensory deficits b/l. Globally weak  PSYCHIATRIC: The patient is alert and oriented x 3. Good affect.  SKIN: No obvious rash, lesion, or ulcer.    LABORATORY PANEL:   CBC  Recent Labs Lab 06/10/15 0556  WBC 7.3  HGB 11.9*  HCT 35.0*  PLT 389   ------------------------------------------------------------------------------------------------------------------  Chemistries   Recent Labs Lab 06/09/15 1507 06/10/15 0556  NA 138 141  K 3.2* 3.3*  CL 100* 108  CO2 26 24  GLUCOSE 125* 85  BUN 27* 23*  CREATININE 0.89 0.72  CALCIUM 8.3* 7.3*  AST 121*  --   ALT 108*  --   ALKPHOS 970*  --   BILITOT 11.3*  --    ------------------------------------------------------------------------------------------------------------------  Cardiac Enzymes No results for input(s): TROPONINI in  the last 168 hours. ------------------------------------------------------------------------------------------------------------------  RADIOLOGY:  No results found.   ASSESSMENT AND PLAN:   70 year old male with past medical history of gastrointestinal stromal tumor, osteoarthritis, chronic pain due to malignancy presents to the hospital due to intractable nausea and vomiting  #1 intractable nausea and vomiting-secondary to his underlying malignancy. -Continue supportive care with IV fluids, scheduled Reglan and we'll add some IV Decadron. -Once nausea and vomiting is under better control we'll start some oral supplementation.  #2 metastatic gastrointestinal stromal tumor-recent imaging study shows advancement of disease. -Await further oncology input. Continue supportive care for now. - cont. Tasigna.   #3. Abnormal LFT's - due to metastatic GIST.  - follow LFT's and bili's.  IR attempted to placed stent as outpatient were unsuccessful.    #4. Chronic pain - due to malignancy.  - cont. Oxycodone.      All the records are reviewed and case discussed with Care Management/Social Workerr. Management plans discussed with the patient, family and they are in agreement.  CODE STATUS: DNR  DVT Prophylaxis: Heparin SQ  TOTAL TIME TAKING CARE OF THIS PATIENT: 30 minutes.   POSSIBLE D/C IN 1-2 DAYS, DEPENDING ON CLINICAL CONDITION.   Henreitta Leber M.D on 06/10/2015 at 10:51 AM  Between 7am to 6pm - Pager - [phone]  After 6pm go to [email or web address] - password [email or web address] [facility]  Office  [phone]  CC: Primary care physician; Rusty Aus, MD

## 2015-06-11 ENCOUNTER — Inpatient Hospital Stay: Payer: Medicare Other

## 2015-06-11 ENCOUNTER — Inpatient Hospital Stay: Payer: Medicare Other | Admitting: Oncology

## 2015-06-11 LAB — BASIC METABOLIC PANEL
Anion gap: 6 (ref 5–15)
BUN: 26 mg/dL — AB (ref 6–20)
CALCIUM: 7.1 mg/dL — AB (ref 8.9–10.3)
CO2: 23 mmol/L (ref 22–32)
Chloride: 112 mmol/L — ABNORMAL HIGH (ref 101–111)
Creatinine, Ser: 0.62 mg/dL (ref 0.61–1.24)
GFR calc Af Amer: >60 mL/min (ref >60)
GLUCOSE: 136 mg/dL — AB (ref 65–99)
POTASSIUM: 4.2 mmol/L (ref 3.5–5.1)
Sodium: 141 mmol/L (ref 135–145)

## 2015-06-11 MED ORDER — METOCLOPRAMIDE HCL 10 MG PO TABS
5.0000 mg | ORAL_TABLET | Freq: Four times a day (QID) | ORAL | Status: DC
  Administered 2015-06-11 – 2015-06-12 (×5): 5 mg via ORAL
  Filled 2015-06-11 (×5): qty 1

## 2015-06-11 MED ORDER — DOCUSATE SODIUM 100 MG PO CAPS
100.0000 mg | ORAL_CAPSULE | Freq: Two times a day (BID) | ORAL | Status: DC
  Administered 2015-06-11 (×2): 100 mg via ORAL
  Filled 2015-06-11 (×2): qty 1

## 2015-06-11 MED ORDER — LORAZEPAM 0.5 MG PO TABS
0.5000 mg | ORAL_TABLET | Freq: Every day | ORAL | Status: DC
  Administered 2015-06-12: 0.5 mg via ORAL
  Filled 2015-06-11: qty 1

## 2015-06-11 MED ORDER — DEXAMETHASONE 4 MG PO TABS
4.0000 mg | ORAL_TABLET | Freq: Two times a day (BID) | ORAL | Status: DC
  Administered 2015-06-11 – 2015-06-12 (×2): 4 mg via ORAL
  Filled 2015-06-11 (×2): qty 1

## 2015-06-11 NOTE — Care Management Important Message (Signed)
Important Message  Patient Details  Name: Curtis Valentine MRN: [id number] Date of Birth: Jun 14, 1945   Medicare Important Message Given:  Yes    Shelbie Ammons, RN 06/11/2015, 9:40 AM

## 2015-06-11 NOTE — Progress Notes (Signed)
Writer met with patient and his son in law Curtis Valentine, he has decided that a transfer to the hospice home would be the best plan. Curtis Valentine was admitted to [name] on 3/5 for evaluation of intractable nausea and vomiting. He has a history of a progressive high grade GIST tumor and had started oral chemo approximatly 3 weeks ago, but was unable to tolerate this treatment. He is positive for weight loss, his albumin is 2.1 and appears jaundice. He is now able to eat some bites of popsicle and ice chips. He has been receiving IV Reglan and IV decadron, these were converted to oral today. He remains weak and is unable to walk with out assistance. PMH includes: arthritis. Curtis Valentine and Curtis Valentine have requested that he sign consents in the Valentine with planned transport by car to the hospice home. Signed out of facility DNR in place in patient's chart. Hospice care team all aware of plan for discharge tomorrow. Thank you. Flo Shanks RN, BSN, [place] [facility] [place], [facility] [phone] c

## 2015-06-11 NOTE — Progress Notes (Signed)
[place] at [place] NAME: Curtis Valentine    MR#:  [id number]  DATE OF BIRTH:  [birth year]  SUBJECTIVE:   Patient is here due to intractable nausea and vomiting. Nausea, vomiting much improved and able to eat some clear liquids. Family at bedside.  Tearful about his terminal diagnosis.   REVIEW OF SYSTEMS:    Review of Systems  Constitutional: Negative for fever and chills.  HENT: Negative for congestion and tinnitus.   Eyes: Negative for blurred vision and double vision.  Respiratory: Negative for cough, shortness of breath and wheezing.   Cardiovascular: Negative for chest pain, orthopnea and PND.  Gastrointestinal: Negative for nausea, vomiting, abdominal pain and diarrhea.  Genitourinary: Negative for dysuria and hematuria.  Neurological: Positive for weakness (Generalized). Negative for dizziness, sensory change and focal weakness.  All other systems reviewed and are negative.   Nutrition: Clear Liquids Tolerating Diet:  Yes Tolerating PT: Await Eval.    DRUG ALLERGIES:   Allergies  Allergen Reactions  . Promethazine Nausea Only    During administration, pt felt more nauseated and refused remainder of dose.  Pt requested promethazine be added to allergy list.   . Morphine And Related Other (See Comments)    Very confused.   Marland Kitchen Nexium [Esomeprazole] Other (See Comments)    Interaction with cancer drug "Tasigna"  . Penicillins Hives    Itch and breakout in hives. Has patient had a PCN reaction causing immediate rash, facial/tongue/throat swelling, SOB or lightheadedness with hypotension: Yes Has patient had a PCN reaction causing severe rash involving mucus membranes or skin necrosis: No Has patient had a PCN reaction that required hospitalization No Has patient had a PCN reaction occurring within the last 10 years: Yes If all of the above answers are "NO", then may proceed with Cephalosporin use.  Marland Kitchen Prochlorperazine Nausea  And Vomiting    Patient states this makes him vomit more.  Marland Kitchen Zofran [Ondansetron Hcl] Nausea And Vomiting    Patient states this makes him vomit more.    VITALS:  Blood pressure 110/52, pulse 86, temperature 98.1 F (36.7 C), temperature source Oral, resp. rate 18, height 5\' 8"  (1.727 m), weight 63.866 kg (140 lb 12.8 oz), SpO2 94 %.  PHYSICAL EXAMINATION:   Physical Exam  GENERAL:  70 y.o.-year-old cachectic jaundiced patient lying in bed tearful.  EYES: Pupils equal, round, reactive to light and accommodation. + scleral icterus. Extraocular muscles intact.  HEENT: Head atraumatic, normocephalic. Oropharynx and nasopharynx clear.  NECK:  Supple, no jugular venous distention. No thyroid enlargement, no tenderness.  LUNGS: Normal breath sounds bilaterally, no wheezing, rales, rhonchi. No use of accessory muscles of respiration.  CARDIOVASCULAR: S1, S2 normal. No murmurs, rubs, or gallops.  ABDOMEN: Soft, mildly tender, no rebound, rigidity, distended. Bowel sounds present. No organomegaly or mass.  EXTREMITIES: No cyanosis, clubbing or edema b/l.    NEUROLOGIC: Cranial nerves II through XII are intact. No focal Motor or sensory deficits b/l. Globally weak  PSYCHIATRIC: The patient is alert and oriented x 3. Good affect.  SKIN: No obvious rash, lesion, or ulcer.    LABORATORY PANEL:   CBC  Recent Labs Lab 06/10/15 0556  WBC 7.3  HGB 11.9*  HCT 35.0*  PLT 389   ------------------------------------------------------------------------------------------------------------------  Chemistries   Recent Labs Lab 06/10/15 0556 06/11/15 0439  NA 141 141  K 3.3* 4.2  CL 108 112*  CO2 24 23  GLUCOSE 85 136*  BUN 23*  26*  CREATININE 0.72 0.62  CALCIUM 7.3* 7.1*  AST 117*  --   ALT 91*  --   ALKPHOS 788*  --   BILITOT 9.9*  --    ------------------------------------------------------------------------------------------------------------------  Cardiac Enzymes No results  for input(s): TROPONINI in the last 168 hours. ------------------------------------------------------------------------------------------------------------------  RADIOLOGY:  No results found.   ASSESSMENT AND PLAN:   70 year old male with past medical history of gastrointestinal stromal tumor, osteoarthritis, chronic pain due to malignancy presents to the hospital due to intractable nausea and vomiting  #1 intractable nausea and vomiting-secondary to his underlying malignancy. -Continue supportive care with IV fluids, Reglan but switch to Oral.  Cont. Oral Decadron.    - N/V much improved and started on clear liquid diet.   #2 metastatic gastrointestinal stromal tumor-recent imaging study shows advancement of disease. -Apprecicate Oncology input and pt. Has a terminal diagnosis and no further treatment options at this time.  Pt. Is opted to Hospice and will be discharged to hospice home tomorrow.  #3. Abnormal LFT's - due to metastatic GIST.  - follow LFT's and bili's.  IR attempted to placed stent as outpatient were unsuccessful.    #4. Chronic pain - due to malignancy.  - cont. Oxycodone.    Likely d/c to hospice home  Tomorrow.   All the records are reviewed and case discussed with Care Management/Social Workerr. Management plans discussed with the patient, family and they are in agreement.  CODE STATUS: DNR  DVT Prophylaxis: Heparin SQ  TOTAL TIME TAKING CARE OF THIS PATIENT: 30 minutes.   POSSIBLE D/C IN 1-2 DAYS, DEPENDING ON CLINICAL CONDITION.   Henreitta Leber M.D on 06/11/2015 at 12:29 PM  Between 7am to 6pm - Pager - [phone]  After 6pm go to [email or web address] - password [email or web address] [facility]  Office  [phone]  CC: Primary care physician; Rusty Aus, MD

## 2015-06-11 NOTE — Progress Notes (Signed)
[place]  [phone] [phone]  ID: Curtis Valentine OB: December 20, 1945  MR#: [id number]  [id number]  Patient Care Team: Rusty Aus, MD as PCP - General (Internal Medicine)  CHIEF COMPLAINT:  Chief Complaint  Patient presents with  . Emesis    INTERVAL HISTORY: Patient's nausea vomiting have improved. He continues to feel persistently weak and fatigued. He is highly anxious, but offers no further complaints.  REVIEW OF SYSTEMS:   Review of Systems  Constitutional: Positive for weight loss and malaise/fatigue. Negative for fever.  Respiratory: Negative.  Negative for shortness of breath.   Cardiovascular: Negative.  Negative for chest pain.  Gastrointestinal: Positive for nausea. Negative for vomiting and abdominal pain.  Musculoskeletal: Negative.   Neurological: Positive for weakness.  Psychiatric/Behavioral: The patient is nervous/anxious.     As per HPI. Otherwise, a complete review of systems is negatve.  PAST MEDICAL HISTORY: Past Medical History  Diagnosis Date  . 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 Arthritis   . GIST (gastrointestinal stroma tumor), malignant, colon ([place])     PAST SURGICAL HISTORY: Past Surgical History  Procedure Laterality Date  . Gist tumor surgery      FAMILY HISTORY Family History  Problem Relation Age of Onset  . Hypertension Father        ADVANCED DIRECTIVES:    HEALTH MAINTENANCE: Social History  Substance Use Topics  . Smoking status: Never Smoker   . Smokeless tobacco: Never Used  . Alcohol Use: Yes     Comment: wine-occasional     Colonoscopy:  PAP:  Bone density:  Lipid panel:  Allergies  Allergen Reactions  . Promethazine Nausea Only    During administration, pt felt more nauseated and refused remainder of dose.  Pt requested promethazine be added to allergy list.   . Morphine And Related Other (See Comments)    Very confused.   Marland Kitchen Nexium [Esomeprazole] Other (See  Comments)    Interaction with cancer drug "Tasigna"  . Penicillins Hives    Itch and breakout in hives. Has patient had a PCN reaction causing immediate rash, facial/tongue/throat swelling, SOB or lightheadedness with hypotension: Yes Has patient had a PCN reaction causing severe rash involving mucus membranes or skin necrosis: No Has patient had a PCN reaction that required hospitalization No Has patient had a PCN reaction occurring within the last 10 years: Yes If all of the above answers are "NO", then may proceed with Cephalosporin use.  Marland Kitchen Prochlorperazine Nausea And Vomiting    Patient states this makes him vomit more.  Marland Kitchen Zofran [Ondansetron Hcl] Nausea And Vomiting    Patient states this makes him vomit more.    Current Facility-Administered Medications  Medication Dose Route Frequency Provider Last Rate Last Dose  . 0.9 % NaCl with KCl 40 mEq / L  infusion   Intravenous Continuous Henreitta Leber, MD 75 mL/hr at 06/11/15 1005 75 mL/hr at 06/11/15 1005  . dexamethasone (DECADRON) tablet 4 mg  4 mg Oral Q12H Henreitta Leber, MD   4 mg at 06/11/15 1007  . docusate sodium (COLACE) capsule 100 mg  100 mg Oral BID Henreitta Leber, MD   100 mg at 06/11/15 1226  . heparin injection 5,000 Units  5,000 Units Subcutaneous 3 times per day Vaughan Basta, MD   5,000 Units at 06/11/15 0551  . LORazepam (ATIVAN) tablet 0.5 mg  0.5 mg Oral Daily Henreitta Leber, MD   0.5  mg at 06/11/15 1007  . metoCLOPramide (REGLAN) tablet 5 mg  5 mg Oral QID Henreitta Leber, MD   5 mg at 06/11/15 1226  . multivitamin with minerals tablet 1 tablet  1 tablet Oral Daily Vaughan Basta, MD   1 tablet at 06/10/15 1132  . nilotinib (TASIGNA) capsule 200 mg  200 mg Oral Q12H Vaughan Basta, MD   200 mg at 06/10/15 0032  . ondansetron (ZOFRAN) tablet 8 mg  8 mg Oral Q8H PRN Vaughan Basta, MD      . oxyCODONE (Oxy IR/ROXICODONE) immediate release tablet 10 mg  10 mg Oral Q6H PRN Vaughan Basta, MD      . promethazine (PHENERGAN) injection 25 mg  25 mg Intravenous Once Lavonia Drafts, MD   12.5 mg at 06/09/15 2008  . vitamin C (ASCORBIC ACID) tablet 500 mg  500 mg Oral Daily Vaughan Basta, MD   500 mg at 06/10/15 1133  . vitamin E capsule 400 Units  400 Units Oral Daily Vaughan Basta, MD   400 Units at 06/10/15 1134    OBJECTIVE: Filed Vitals:   06/11/15 0518 06/11/15 1249  BP: 110/52 115/70  Pulse: 86 89  Temp: 98.1 F (36.7 C) 98.1 F (36.7 C)  Resp: 18 20     Body mass index is 21.41 kg/(m^2).    ECOG FS:3 - Symptomatic, >50% confined to bed  General: Ill-appearing, no acute distress. Eyes: Pink conjunctiva, anicteric sclera. Lungs: Clear to auscultation bilaterally. Heart: Regular rate and rhythm. No rubs, murmurs, or gallops. Abdomen: Soft, nontender, nondistended. No organomegaly noted, normoactive bowel sounds. Musculoskeletal: No edema, cyanosis, or clubbing. Neuro: Alert, answering all questions appropriately. Cranial nerves grossly intact. Skin: Jaundiced.  Psych: Normal affect.    LAB RESULTS:  Lab Results  Component Value Date   NA 141 06/11/2015   K 4.2 06/11/2015   CL 112* 06/11/2015   CO2 23 06/11/2015   GLUCOSE 136* 06/11/2015   BUN 26* 06/11/2015   CREATININE 0.62 06/11/2015   CALCIUM 7.1* 06/11/2015   PROT 5.2* 06/10/2015   ALBUMIN 2.1* 06/10/2015   AST 117* 06/10/2015   ALT 91* 06/10/2015   ALKPHOS 788* 06/10/2015   BILITOT 9.9* 06/10/2015   GFRNONAA >60 06/11/2015   GFRAA >60 06/11/2015    Lab Results  Component Value Date   WBC 7.3 06/10/2015   NEUTROABS 5.6 05/27/2015   HGB 11.9* 06/10/2015   HCT 35.0* 06/10/2015   MCV 92.7 06/10/2015   PLT 389 06/10/2015     STUDIES: No results found.  ASSESSMENT: Progressive, high-grade just GIST. Now with declining performance status with intractable nausea and vomiting.  PLAN:    1. GIST: See clinic note dated February 16, 2013 for detailed history of his  disease process. CT scan results from April 25, 2015 reviewed independently with significant increase in his known heterogeneous mass near the head of the pancreas likely invading the liver. Patient is not a surgical candidate. Interventional radiology was unable to complete percutaneous bile duct cannulation. Patient cannot take Gleevec at this time given his worsening bilirubin. Given his worsening nausea and vomiting, he has also been instructed to discontinue Tasigna. Patient has agreed to hospice and will be transferred to hospice home likely tomorrow.  2. Nausea and vomiting: Mildly improved, oral intake as tolerated. Continue current treatments. 3. Elevated liver enzymes: Multifactorial, including probable progression of disease. 4. Elevated bilirubin: Likely secondary to progression of disease. Percutaneous cannulation was unsuccessful and ERCP is not an option. 5.  Pain: Continue current narcotic regimen as prescribed. 6. Disposition: Discharge to hospice home tomorrow. No follow-up necessary in the [place].  Patient is DO NOT RESUSCITATE/DO NOT INTUBATE.    Lloyd Huger, MD   06/11/2015 2:48 PM

## 2015-06-12 MED ORDER — OXYCODONE HCL 10 MG PO TABS: 10.0000 mg | ORAL_TABLET | Freq: Four times a day (QID) | ORAL | Status: DC | PRN

## 2015-06-12 MED ORDER — DEXAMETHASONE 4 MG PO TABS: 4.0000 mg | ORAL_TABLET | Freq: Two times a day (BID) | ORAL | Status: AC

## 2015-06-12 MED ORDER — METOCLOPRAMIDE HCL 5 MG PO TABS: 5.0000 mg | ORAL_TABLET | Freq: Four times a day (QID) | ORAL | Status: AC

## 2015-06-12 MED ORDER — LORAZEPAM 0.5 MG PO TABS: 0.5000 mg | ORAL_TABLET | Freq: Three times a day (TID) | ORAL | Status: AC | PRN

## 2015-06-12 NOTE — Progress Notes (Signed)
Follow up visit on new hospice home referral. Patient alert and oriented, anxious and tearful. Oral medications given by staff RN mary during visit. Patient's brother in Hydrologist at bedside and signed consents at patient request d/t his poor eyesight. Consents faxed to referral. Report called to the hospice home. Plan remains for patient to transfer to the hospice home via [place] when ready. Signed DNR in place in discharge packet. Thank you. Flo Shanks RN, BSN, [facility] [place], [facility] [phone] c

## 2015-06-12 NOTE — Discharge Summary (Addendum)
Curtis Valentine at [facility] NAME: Curtis Valentine    MR#:  [id number]  DATE OF BIRTH:  05-06-1945  DATE OF ADMISSION:  06/09/2015  ADMITTING PHYSICIAN: Curtis Basta, MD  DATE OF DISCHARGE: 06/12/2015  PRIMARY CARE PHYSICIAN: Curtis Aus, MD    ADMISSION DIAGNOSIS:  Dehydration [E86.0] Intractable vomiting with nausea, vomiting of unspecified type [R11.10]  DISCHARGE DIAGNOSIS:  Principal Problem:   Intractable nausea and vomiting Active Problems:   Obstructive jaundice   SECONDARY DIAGNOSIS:   Past Medical History  Diagnosis Date  . 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 Arthritis   . GIST (gastrointestinal stroma tumor), malignant, colon [facility])     HOSPITAL COURSE:    70 year old male with past medical history of gastrointestinal stromal tumor, osteoarthritis, chronic pain due to malignancy presents to the hospital due to intractable nausea and vomiting  1 intractable nausea and vomiting: This is due to his underlying malignancy. Continue Decadron and supportive care N/V much improved and started on clear liquid diet.   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  Has a terminal diagnosis and no further treatment options at this time. He has opted to Hospice and will be discharged to hospice home.  3. Abnormal LFT's: This is due to metastatic GIST.   IR attempted to placed stent as outpatient were unsuccessful.   44. Chronic pain : This is due to malignancy.    DISCHARGE CONDITIONS AND DIET:   Stable Hospice home  CONSULTS OBTAINED:  Treatment Team:  Curtis Huger, MD  DRUG ALLERGIES:   Allergies  Allergen Reactions  . Promethazine Nausea Only    During administration, pt felt more nauseated and refused remainder of dose.  Pt requested promethazine be added to allergy list.   . Morphine And Related Other (See Comments)    Very confused.    Marland Kitchen Nexium [Esomeprazole] Other (See Comments)    Interaction with cancer drug "Tasigna"  . Penicillins Hives    Itch and breakout in hives. Has patient had a PCN reaction causing immediate rash, facial/tongue/throat swelling, SOB or lightheadedness with hypotension: Yes Has patient had a PCN reaction causing severe rash involving mucus membranes or skin necrosis: No Has patient had a PCN reaction that required hospitalization No Has patient had a PCN reaction occurring within the last 10 years: Yes If all of the above answers are "NO", then may proceed with Cephalosporin use.  Marland Kitchen Prochlorperazine Nausea And Vomiting    Patient states this makes him vomit more.  Marland Kitchen Zofran [Ondansetron Hcl] Nausea And Vomiting    Patient states this makes him vomit more.    DISCHARGE MEDICATIONS:   Current Discharge Medication List    START taking these medications   Details  dexamethasone (DECADRON) 4 MG tablet Take 1 tablet (4 mg total) by mouth every 12 (twelve) hours. Qty: 30 tablet, Refills: 0    metoCLOPramide (REGLAN) 5 MG tablet Take 1 tablet (5 mg total) by mouth 4 (four) times daily. Qty: 120 tablet, Refills: 0      CONTINUE these medications which have CHANGED   Details  LORazepam (ATIVAN) 0.5 MG tablet Take 1 tablet (0.5 mg total) by mouth every 8 (eight) hours as needed for anxiety. Qty: 30 tablet, Refills: 0      STOP taking these medications     celecoxib (CELEBREX) 200 MG capsule      Glucosamine-Chondroitin-Ca-D3 (TRIPLE  FLEX 50+ PO)      LUMIGAN 0.01 % SOLN      megestrol (MEGACE) 40 MG tablet      Multiple Vitamins-Minerals (MULTIVITAMIN MEN PO)      nilotinib (TASIGNA) 200 MG capsule      Omega-3 Fatty Acids (FISH OIL) 1000 MG CAPS      Oxycodone HCl 10 MG TABS      traMADol (ULTRAM) 50 MG tablet      TURMERIC PO      vitamin C (ASCORBIC ACID) 500 MG tablet      vitamin E 400 UNIT capsule      furosemide (LASIX) 20 MG tablet      ondansetron (ZOFRAN) 8 MG  tablet      prochlorperazine (COMPAZINE) 10 MG tablet               Today   CHIEF COMPLAINT:  Patient very curious morning. He is complaining of poor by mouth intake. He seems unhappy about the unknown.   VITAL SIGNS:  Blood pressure 116/62, pulse 89, temperature 98 F (36.7 C), temperature source Oral, resp. rate 19, height 5\' 8"  (1.727 m), weight 63.866 kg (140 lb 12.8 oz), SpO2 94 %.   REVIEW OF SYSTEMS:  Review of Systems  Constitutional: Positive for weight loss and malaise/fatigue. Negative for fever and chills.  HENT: Negative for sore throat.   Eyes: Negative for blurred vision.  Respiratory: Negative for cough, hemoptysis, shortness of breath and wheezing.   Cardiovascular: Negative for chest pain, palpitations and leg swelling.  Gastrointestinal: Negative for nausea, vomiting, abdominal pain, diarrhea and blood in stool.  Genitourinary: Negative for dysuria.  Musculoskeletal: Negative for back pain.  Neurological: Positive for weakness. Negative for dizziness, tremors, focal weakness, seizures and headaches.  Endo/Heme/Allergies: Does not bruise/bleed easily.  Psychiatric/Behavioral: Positive for depression and suicidal ideas. Negative for memory loss. The patient does not have insomnia.      PHYSICAL EXAMINATION:  GENERAL:  70 y.o.-year-old patient lying in the bed with no acute distress.  NECK:  Supple, no jugular venous distention. No thyroid enlargement, no tenderness.  LUNGS: Normal breath sounds bilaterally, no wheezing, rales,rhonchi  No use of accessory muscles of respiration.  CARDIOVASCULAR: S1, S2 normal. No murmurs, rubs, or gallops.  ABDOMEN: Soft, non-tender, non-distended. Bowel sounds present. No organomegaly or mass.  EXTREMITIES: No pedal edema, cyanosis, or clubbing.  PSYCHIATRIC: The patient is alert and oriented x 3.  SKIN: No obvious rash, lesion, or ulcer. Jaundice  DATA REVIEW:   CBC  Recent Labs Lab 06/10/15 0556  WBC 7.3   HGB 11.9*  HCT 35.0*  PLT 389    Chemistries   Recent Labs Lab 06/10/15 0556 06/11/15 0439  NA 141 141  K 3.3* 4.2  CL 108 112*  CO2 24 23  GLUCOSE 85 136*  BUN 23* 26*  CREATININE 0.72 0.62  CALCIUM 7.3* 7.1*  AST 117*  --   ALT 91*  --   ALKPHOS 788*  --   BILITOT 9.9*  --     Cardiac Enzymes No results for input(s): TROPONINI in the last 168 hours.  Microbiology Results  @[email or web address] @  RADIOLOGY:  No results found.    Management plans discussed with the patient and he is in agreement. Stable for discharge hospice  Patient should follow up with hospice  CODE STATUS:     Code Status Orders        Start     Ordered   06/09/15 2315  Do not attempt resuscitation (DNR)   Continuous    Question Answer Comment  In the event of cardiac or respiratory ARREST Do not call a "code blue"   In the event of cardiac or respiratory ARREST Do not perform Intubation, CPR, defibrillation or ACLS   In the event of cardiac or respiratory ARREST Use medication by any route, position, wound care, and other measures to relive pain and suffering. May use oxygen, suction and manual treatment of airway obstruction as needed for comfort.      06/09/15 2315    Code Status History    Date Active Date Inactive Code Status Order ID Comments User Context   This patient has a current code status but no historical code status.    Advance Directive Documentation        Most Recent Value   Type of Advance Directive  Healthcare Power of Attorney   Pre-existing out of facility DNR order (yellow form or pink MOST form)     "MOST" Form in Place?        TOTAL TIME TAKING CARE OF THIS PATIENT: 35 minutes.    Note: This dictation was prepared with Dragon dictation along with smaller phrase technology. Any transcriptional errors that result from this process are unintentional.  Secundino Ellithorpe M.D on 06/12/2015 at 10:35 AM  Between 7am to 6pm - Pager - [phone] After 6pm go to  [email or web address] - password [email or web address] [facility]  Office  [phone]  CC: Primary care physician; Curtis Aus, MD

## 2015-06-12 NOTE — Care Management (Signed)
Discharge to [facility] today per Dr. Benjie Karvonen. Mr. Tripp and family members are in agreement. Will be transported per [facility]. Shelbie Ammons RN MSN CCM Care Management [phone]

## 2015-08-05 DEATH — deceased

## 2017-06-29 IMAGING — XA IR ABSCESS DRAINAGE
1 series · 1 of 1 positions shown · non-contrast
Comparison: none

INDICATION: 69-year-old with GIST and abnormal liver function tests. Patient has
an elevated bilirubin level and trying to decrease bilirubin level
so the patient can get additional oncology treatment.

[Series 1: single · 1 of 1 slices shown]
[im 1/1]
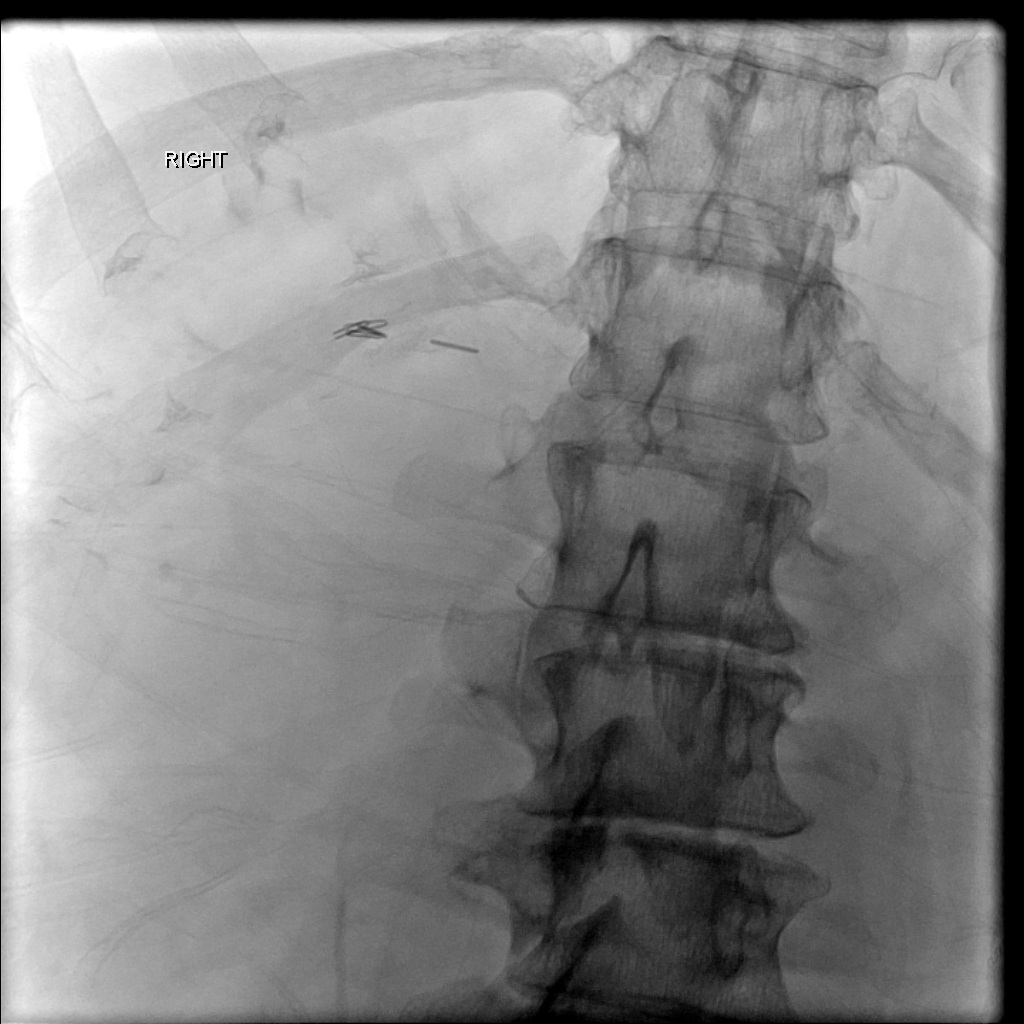

[1 of 1 positions shown; findings below may reference images not displayed]

EXAM:
ATTEMPTED PERCUTANEOUS TRANSHEPATIC CHOLANGIOGRAM WITH ULTRASOUND
AND FLUOROSCOPIC GUIDANCE

MEDICATIONS:
Vancomycin 1 g. Vancomycin was given within two hours of incision.
Vancomycin was given due to a penicillin allergy.

ANESTHESIA/SEDATION:
Fentanyl 125 mcg IV; Versed 4 mg IV

Moderate Sedation Time:  48

The patient was continuously monitored during the procedure by the
interventional radiology nurse under my direct supervision.

FLUOROSCOPY TIME:  5.6 minutes, 89 mGy

COMPLICATIONS:
None immediate.

PROCEDURE:
Informed written consent was obtained from the patient after a
thorough discussion of the procedural risks, benefits and
alternatives. All questions were addressed. Maximal Sterile Barrier
Technique was utilized including caps, mask, sterile gowns, sterile
gloves, sterile drape, hand hygiene and skin antiseptic. A timeout
was performed prior to the initiation of the procedure.

The abdomen was evaluated with ultrasound. A large mass in the upper
abdomen adjacent to the liver was identified. Skin was anesthetized
with 1% lidocaine. Using ultrasound and fluoroscopic guidance, 22
gauge Ioannhs Xalil were directed into the liver from both right and
left hepatic approaches. There was no significant peripheral
intrahepatic biliary dilatation. Attempts were made to puncture one
of the central biliary ducts but this was unsuccessful. Contrast was
unable to opacify the biliary system on multiple occasions. Biliary
system could never be successfully opacified with contrast.
Therefore, a biliary drain was not placed.
FINDINGS: Large mass along the inferior aspect of the liver. There is no
significant intrahepatic biliary dilatation. Needle was directed
into the liver with ultrasound and fluoroscopic guidance. The
biliary system could never be opacified with contrast.
IMPRESSION: Attempted percutaneous transhepatic cholangiogram with ultrasound
and fluoroscopic guidance. The biliary system could not be
successfully opacified with contrast due to lack of biliary
dilatation. Therefore, biliary drain could not be placed.

Findings were discussed with Dr. Fabrino. Plan to follow patient's
liver function tests and repeat imaging if the bilirubin continues
to increase.
